# Patient Record
Sex: Female | Born: 1980 | Race: Black or African American | Hispanic: No | Marital: Single | Smoking: Never smoker
Health system: Southern US, Community
[De-identification: ages and names within clinical notes are randomized; demographics above are authoritative.]

## PROBLEM LIST (undated history)

## (undated) DIAGNOSIS — I619 Nontraumatic intracerebral hemorrhage, unspecified: Secondary | ICD-10-CM

## (undated) DIAGNOSIS — H9193 Unspecified hearing loss, bilateral: Secondary | ICD-10-CM

## (undated) DIAGNOSIS — H547 Unspecified visual loss: Secondary | ICD-10-CM

## (undated) HISTORY — PX: SCAR REVISION: SHX5285

## (undated) HISTORY — PX: PATENT DUCTUS ARTERIOUS REPAIR: SHX269

## (undated) HISTORY — DX: Unspecified hearing loss, bilateral: H91.93

## (undated) HISTORY — DX: Unspecified visual loss: H54.7

## (undated) HISTORY — PX: WISDOM TOOTH EXTRACTION: SHX21

---
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
## 2018-02-27 ENCOUNTER — Ambulatory Visit (INDEPENDENT_AMBULATORY_CARE_PROVIDER_SITE_OTHER): Payer: BLUE CROSS/BLUE SHIELD | Admitting: Obstetrics and Gynecology

## 2018-02-27 ENCOUNTER — Encounter: Payer: Self-pay | Admitting: Obstetrics and Gynecology

## 2018-02-27 ENCOUNTER — Ambulatory Visit: Payer: Self-pay | Admitting: Obstetrics and Gynecology

## 2018-02-27 ENCOUNTER — Other Ambulatory Visit (HOSPITAL_COMMUNITY)
Admission: RE | Admit: 2018-02-27 | Discharge: 2018-02-27 | Disposition: A | Discharge status: Home / Self Care | Payer: BLUE CROSS/BLUE SHIELD | Source: Ambulatory Visit | Attending: Obstetrics and Gynecology | Admitting: Obstetrics and Gynecology

## 2018-02-27 VITALS — BP 107/76 | HR 109 | Ht 64.0 in | Wt 131.5 lb

## 2018-02-27 DIAGNOSIS — Z01419 Encounter for gynecological examination (general) (routine) without abnormal findings: Secondary | ICD-10-CM | POA: Insufficient documentation

## 2018-02-27 DIAGNOSIS — Z1151 Encounter for screening for human papillomavirus (HPV): Secondary | ICD-10-CM | POA: Insufficient documentation

## 2018-02-27 NOTE — Progress Notes (Signed)
GYNECOLOGY ANNUAL PREVENTATIVE CARE ENCOUNTER NOTE  Subjective:   Janet Dennis is a 37 y.o. G0 female here for a annual gynecologic exam. Current complaints: none. Has monthly periods, lasting 3-4 days, light.  Denies abnormal vaginal bleeding, discharge, pelvic pain, or other gynecologic concerns.   Has never been sexually active, declines STI testing.    Gynecologic History Patient's last menstrual period was 02/21/2018. Contraception: abstinence Last Pap: unable to be done secondary to patient discomfort (2016) Last mammogram: n/a  Obstetric History OB History  Gravida Para Term Preterm AB Living  0 0 0 0 0 0  SAB TAB Ectopic Multiple Live Births  0 0 0 0 0    Past Medical History:  Diagnosis Date  . Bilateral hearing loss   . Blind [id number]   Past Surgical History:  Procedure Laterality Date  . WISDOM TOOTH EXTRACTION     No current outpatient medications on file prior to visit.   No current facility-administered medications on file prior to visit.    Not on File  Social History   Socioeconomic History  . Marital status: Single    Spouse name: Not on file  . Number of children: Not on file  . Years of education: Not on file  . Highest education level: Not on file  Occupational History  . Not on file  Social Needs  . Financial resource strain: Not on file  . Food insecurity:    Worry: Not on file    Inability: Not on file  . Transportation needs:    Medical: Not on file    Non-medical: Not on file  Tobacco Use  . Smoking status: Never Smoker  . Smokeless tobacco: Never Used  Substance and Sexual Activity  . Alcohol use: Yes    Comment: occ  . Drug use: Never  . Sexual activity: Never    Birth control/protection: None  Lifestyle  . Physical activity:    Days per week: Not on file    Minutes per session: Not on file  . Stress: Not on file  Relationships  . Social connections:    Talks on phone: Not on file    Gets together: Not on  file    Attends religious service: Not on file    Active member of club or organization: Not on file    Attends meetings of clubs or organizations: Not on file    Relationship status: Not on file  . Intimate partner violence:    Fear of current or ex partner: Not on file    Emotionally abused: Not on file    Physically abused: Not on file    Forced sexual activity: Not on file  Other Topics Concern  . Not on file  Social History Narrative  . Not on file   History reviewed. No pertinent family history.  Diet: "pretty okay" Exercise: goes with mom to walk  The following portions of the patient's history were reviewed and updated as appropriate: allergies, current medications, past family history, past medical history, past social history, past surgical history and problem list.  Review of Systems Pertinent items are noted in HPI.   Objective:  BP 107/76   Pulse (!) 109   Ht 5\' 4"  (1.626 m)   Wt 131 lb 8 oz (59.6 kg)   LMP 02/21/2018   BMI 22.57 kg/m  CONSTITUTIONAL: Well-developed, well-nourished female in no acute distress.  HENT:  Normocephalic, atraumatic, External right and left ear normal. Oropharynx is clear  and moist EYES: gray scarring over both cornea, patient is blind  NECK: Normal range of motion, supple, no masses.  Normal thyroid.  SKIN: Skin is warm and dry. No rash noted. Not diaphoretic. No erythema. No pallor. NEUROLOGIC: Alert and oriented to person, place, and time. Normal reflexes, muscle tone coordination. Hard of hearing with hearing aid PSYCHIATRIC: Normal mood and affect. Normal behavior. Normal judgment and thought content. CARDIOVASCULAR: Normal heart rate noted, regular rhythm RESPIRATORY: Clear to auscultation bilaterally. Effort and breath sounds normal, no problems with respiration noted. BREASTS: Symmetric in size. No masses, skin changes, nipple drainage, or lymphadenopathy. ABDOMEN: Soft, normal bowel sounds, no distention noted.  No  tenderness, rebound or guarding.  PELVIC: Normal appearing external genitalia; virginal introitus, no hymen present.  Some white vaginal dsicharge noted. Adolescent speculum used with great discomfort to patient, cervix high and not well visualized, blind pap of posterior fornix/cervix done.  Normal uterine size, no other palpable masses, no uterine or adnexal tenderness. MUSCULOSKELETAL: Normal range of motion. No tenderness.  No cyanosis, clubbing, or edema. 2+ distal pulses.   Assessment and Plan:   1. Well woman exam Difficult exam due to patient discomfort (not sexually active with very small introitus, cervix not visualized) but otherwise benign exam - Cytology - PAP  Will follow up results of pap smear and manage accordingly. Encouraged improvement in diet and exercise.    Routine preventative health maintenance measures emphasized. Please refer to After Visit Summary for other counseling recommendations.    Baldemar Lenis, M.D. [facility]

## 2018-02-27 NOTE — Progress Notes (Signed)
Pt presents as New pt. Pt has not other concerns. Just needs pap smear. Pt moved to area in 2017. Pap has not been done since moved to this area.

## 2018-03-01 LAB — CYTOLOGY - PAP
Adequacy: ABSENT
Diagnosis: NEGATIVE
HPV: NOT DETECTED

## 2020-08-03 DIAGNOSIS — R569 Unspecified convulsions: Secondary | ICD-10-CM

## 2020-08-03 HISTORY — DX: Unspecified convulsions: R56.9

## 2020-08-17 ENCOUNTER — Emergency Department (HOSPITAL_COMMUNITY): Payer: BC Managed Care – PPO

## 2020-08-17 ENCOUNTER — Encounter (HOSPITAL_COMMUNITY): Payer: Self-pay

## 2020-08-17 ENCOUNTER — Observation Stay (HOSPITAL_COMMUNITY)
Admission: EM | Admit: 2020-08-17 | Discharge: 2020-08-19 | Disposition: A | Discharge status: Home / Self Care | Payer: BC Managed Care – PPO | Attending: Internal Medicine | Admitting: Internal Medicine

## 2020-08-17 ENCOUNTER — Other Ambulatory Visit: Payer: Self-pay

## 2020-08-17 DIAGNOSIS — R000 Tachycardia, unspecified: Secondary | ICD-10-CM | POA: Diagnosis not present

## 2020-08-17 DIAGNOSIS — E0590 Thyrotoxicosis, unspecified without thyrotoxic crisis or storm: Secondary | ICD-10-CM | POA: Diagnosis present

## 2020-08-17 DIAGNOSIS — D649 Anemia, unspecified: Secondary | ICD-10-CM | POA: Insufficient documentation

## 2020-08-17 DIAGNOSIS — E871 Hypo-osmolality and hyponatremia: Secondary | ICD-10-CM | POA: Diagnosis present

## 2020-08-17 DIAGNOSIS — R569 Unspecified convulsions: Principal | ICD-10-CM

## 2020-08-17 DIAGNOSIS — D509 Iron deficiency anemia, unspecified: Secondary | ICD-10-CM | POA: Diagnosis not present

## 2020-08-17 DIAGNOSIS — Z20822 Contact with and (suspected) exposure to covid-19: Secondary | ICD-10-CM | POA: Insufficient documentation

## 2020-08-17 HISTORY — DX: Nontraumatic intracerebral hemorrhage, unspecified: I61.9

## 2020-08-17 LAB — MAGNESIUM: Magnesium: 1.8 mg/dL (ref 1.7–2.4)

## 2020-08-17 LAB — CBC WITH DIFFERENTIAL/PLATELET
Abs Immature Granulocytes: 0.02 10*3/uL (ref 0.00–0.07)
Basophils Absolute: 0 10*3/uL (ref 0.0–0.1)
Basophils Relative: 0 %
Eosinophils Absolute: 0 10*3/uL (ref 0.0–0.5)
Eosinophils Relative: 1 %
HCT: 36 % (ref 36.0–46.0)
Hemoglobin: 11.3 g/dL — ABNORMAL LOW (ref 12.0–15.0)
Immature Granulocytes: 0 %
Lymphocytes Relative: 43 %
Lymphs Abs: 2.2 10*3/uL (ref 0.7–4.0)
MCH: 25 pg — ABNORMAL LOW (ref 26.0–34.0)
MCHC: 31.4 g/dL (ref 30.0–36.0)
MCV: 79.6 fL — ABNORMAL LOW (ref 80.0–100.0)
Monocytes Absolute: 0.6 10*3/uL (ref 0.1–1.0)
Monocytes Relative: 11 %
Neutro Abs: 2.2 10*3/uL (ref 1.7–7.7)
Neutrophils Relative %: 45 %
Platelets: 173 10*3/uL (ref 150–400)
RBC: 4.52 MIL/uL (ref 3.87–5.11)
RDW: 14.9 % (ref 11.5–15.5)
WBC: 5 10*3/uL (ref 4.0–10.5)
nRBC: 0 % (ref 0.0–0.2)

## 2020-08-17 LAB — BASIC METABOLIC PANEL
Anion gap: 10 (ref 5–15)
BUN: 15 mg/dL (ref 6–20)
CO2: 17 mmol/L — ABNORMAL LOW (ref 22–32)
Calcium: 9 mg/dL (ref 8.9–10.3)
Chloride: 102 mmol/L (ref 98–111)
Creatinine, Ser: 0.65 mg/dL (ref 0.44–1.00)
GFR, Estimated: >60 mL/min (ref >60)
Glucose, Bld: 72 mg/dL (ref 70–99)
Potassium: 5.2 mmol/L — ABNORMAL HIGH (ref 3.5–5.1)
Sodium: 129 mmol/L — ABNORMAL LOW (ref 135–145)

## 2020-08-17 LAB — I-STAT BETA HCG BLOOD, ED (MC, WL, AP ONLY): I-stat hCG, quantitative: <5 m[IU]/mL (ref <5)

## 2020-08-17 LAB — TSH: TSH: <0.01 u[IU]/mL — ABNORMAL LOW (ref 0.350–4.500)

## 2020-08-17 LAB — RESP PANEL BY RT-PCR (FLU A&B, COVID) ARPGX2
Influenza A by PCR: NEGATIVE
Influenza B by PCR: NEGATIVE
SARS Coronavirus 2 by RT PCR: NEGATIVE

## 2020-08-17 LAB — CBG MONITORING, ED: Glucose-Capillary: 69 mg/dL — ABNORMAL LOW (ref 70–99)

## 2020-08-17 LAB — D-DIMER, QUANTITATIVE: D-Dimer, Quant: 0.62 ug/mL-FEU — ABNORMAL HIGH (ref 0.00–0.50)

## 2020-08-17 MED ORDER — SODIUM CHLORIDE 0.9 % IV BOLUS
1000.0000 mL | Freq: Once | INTRAVENOUS | Status: AC
  Administered 2020-08-17: 1000 mL via INTRAVENOUS

## 2020-08-17 MED ORDER — IOHEXOL 350 MG/ML SOLN
50.0000 mL | Freq: Once | INTRAVENOUS | Status: AC | PRN
  Administered 2020-08-17: 50 mL via INTRAVENOUS

## 2020-08-17 MED ORDER — METOPROLOL TARTRATE 25 MG PO TABS
25.0000 mg | ORAL_TABLET | Freq: Once | ORAL | Status: AC
  Administered 2020-08-17: 25 mg via ORAL
  Filled 2020-08-17: qty 1

## 2020-08-17 NOTE — ED Triage Notes (Signed)
EMS reports pt is from home. Pt is blind and hearing impaired. Family is on the way. Family checked on her and was in bed and then moved to floor, found  Having convulsions - No history of seizures. Pt has been complaining of hot flashes recently. Had tylenol x 2 today.

## 2020-08-17 NOTE — ED Notes (Signed)
Dr. Renaye Rakers is at bedside at this time.

## 2020-08-17 NOTE — ED Provider Notes (Signed)
[facility] EMERGENCY DEPARTMENT Provider Note   CSN: [id number] Arrival date & time: 08/17/20  1944     History CC:  Shaking episode, tachycardia  Janet Dennis is a 40 y.o. female w/ hx of prematurity, blindness, presenting to ED with tachycardia and concern for possible seizure.  EMS reports family saw patient having generalized convulsions in bedroom today.  No prior hx of seizures.  Pt reporting "hot flashes" for several days, but otherwise feeling normal today.  She has no acute complaints, denies palpitations, HA, chest pain or pressure.    Father had graves disease or hyperthyroidism No family hx of seizures  Update  - mother reports she walked into bedroom this afternoon to find pt sitting on bed, unresponsive, clenched up, made a garbling noise.  She was confused until arriving in the Ed.  Patient does not recall these events at all.  At baseline pt awake and communicative.  No hx of sig developmental delay.  HPI     Past Medical History:  Diagnosis Date  . Bilateral hearing loss   . Blind 1982    There are no problems to display for this patient.   Past Surgical History:  Procedure Laterality Date  . WISDOM TOOTH EXTRACTION       OB History    Gravida  0   Para  0   Term  0   Preterm  0   AB  0   Living  0     SAB  0   IAB  0   Ectopic  0   Multiple  0   Live Births  0           History reviewed. No pertinent family history.  Social History   Tobacco Use  . Smoking status: Never Smoker  . Smokeless tobacco: Never Used  Vaping Use  . Vaping Use: Never used  Substance Use Topics  . Alcohol use: Yes    Comment: occ  . Drug use: Never    Home Medications Prior to Admission medications   Not on File    Allergies    Patient has no known allergies.  Review of Systems   Review of Systems  Constitutional: Negative for chills and fever.  HENT: Negative for ear pain and sore throat.   Eyes:  Negative for pain and visual disturbance.  Respiratory: Negative for cough and shortness of breath.   Cardiovascular: Negative for chest pain and palpitations.  Gastrointestinal: Negative for abdominal pain and vomiting.  Musculoskeletal: Negative for arthralgias and back pain.  Skin: Negative for color change and rash.  Neurological: Positive for syncope. Negative for weakness.  All other systems reviewed and are negative.   Physical Exam Updated Vital Signs Pulse (!) 154   Temp 98 F (36.7 C) (Oral)   Resp (!) 21   Ht 5\' 4"  (1.626 m)   Wt 59.6 kg   SpO2 100%   BMI 22.55 kg/m   Physical Exam Constitutional:      General: She is not in acute distress.    Comments: Slow speech (baseline per parents)  HENT:     Head: Normocephalic and atraumatic.  Eyes:     Comments: Blind  Cardiovascular:     Rate and Rhythm: Regular rhythm. Tachycardia present.  Pulmonary:     Effort: Pulmonary effort is normal. No respiratory distress.  Abdominal:     General: There is no distension.     Tenderness: There is no abdominal  tenderness.  Skin:    General: Skin is warm and dry.  Neurological:     General: No focal deficit present.     Mental Status: She is alert and oriented to person, place, and time. Mental status is at baseline.     Sensory: No sensory deficit.     Motor: No weakness.  Psychiatric:        Mood and Affect: Mood normal.        Behavior: Behavior normal.     ED Results / Procedures / Treatments   Labs (all labs ordered are listed, but only abnormal results are displayed) Labs Reviewed - No data to display  EKG EKG Interpretation  Date/Time:  Tuesday August 17 2020 20:01:12 EDT Ventricular Rate:  153 PR Interval:    QRS Duration: 73 QT Interval:  267 QTC Calculation: 426 R Axis:   82 Text Interpretation: Sinus tachycardia Nonspecific T abnormalities, lateral leads No STEMI Confirmed by Alvester Chou [phone]) on 08/17/2020 8:07:39 PM   Radiology No  results found.  Procedures Procedures   Medications Ordered in ED Medications - No data to display  ED Course  I have reviewed the triage vital signs and the nursing notes.  Pertinent labs & imaging results that were available during my care of the patient were reviewed by me and considered in my medical decision making (see chart for details).  40 yo here with possible new onset seizures today, per mother's description, and tachycardia on arrival  DDx includes metabolic derangement vs infection vs thyroid dysfunction vs arrhythmia vs other  ECG on arrival shows sinus tachycardia, telemetry likewise with HR 150-160 bpm.  BP stable.  Pt asymptomatic with this.   Labs reviewed - TSH very low.  Will need T4, T3 levels - likely this is clinical hyperthyroidism.  Doubt thyroid storm at this time.  Metoprolol PO was given for HR control.  She feels well otherwise.  DDimer was elevated.  Patient pending CT PE study.  Parents at bedside provided additional history  Neurology consulted by phone - recommending MRI brain given hx of childhood prematurity.  See ED course below.   Clinical Course as of 08/18/20 0118  Tue Aug 17, 2020  2223 TSH(!): <0.010 [MT]  2338 Dr Marin Roberts from neurology recommending f/u MRI brain.  If unremarkable she could f/u with neurology as outpatient, does NOT need AED initiation at this time.  If patient is being admitted for other reasons, she could have a routine EEG performed in the hospital.  Neurology will be available for further consultation if there are repeat seizures or brain abnormalities found. [MT]  Wed Aug 18, 2020  0020 Signed out to Dr Bebe Shaggy EDP pending MRI (pt transporting to MRI now), reassessment of HR and thyroid levels upon return.   [MT]    Clinical Course User Index [MT] Jannel Lynne, Kermit Balo, MD    Final Clinical Impression(s) / ED Diagnoses Final diagnoses:  None    Rx / DC Orders ED Discharge Orders    None       Terald Sleeper, MD 08/18/20 [phone]

## 2020-08-18 ENCOUNTER — Emergency Department (HOSPITAL_COMMUNITY): Payer: BC Managed Care – PPO

## 2020-08-18 ENCOUNTER — Encounter (HOSPITAL_COMMUNITY): Payer: Self-pay | Admitting: Internal Medicine

## 2020-08-18 ENCOUNTER — Observation Stay (HOSPITAL_COMMUNITY): Payer: BC Managed Care – PPO

## 2020-08-18 DIAGNOSIS — G40109 Localization-related (focal) (partial) symptomatic epilepsy and epileptic syndromes with simple partial seizures, not intractable, without status epilepticus: Secondary | ICD-10-CM

## 2020-08-18 DIAGNOSIS — E0590 Thyrotoxicosis, unspecified without thyrotoxic crisis or storm: Secondary | ICD-10-CM | POA: Diagnosis not present

## 2020-08-18 DIAGNOSIS — G9381 Temporal sclerosis: Secondary | ICD-10-CM | POA: Diagnosis not present

## 2020-08-18 DIAGNOSIS — G40A09 Absence epileptic syndrome, not intractable, without status epilepticus: Secondary | ICD-10-CM | POA: Diagnosis not present

## 2020-08-18 DIAGNOSIS — E871 Hypo-osmolality and hyponatremia: Secondary | ICD-10-CM

## 2020-08-18 DIAGNOSIS — R000 Tachycardia, unspecified: Secondary | ICD-10-CM

## 2020-08-18 DIAGNOSIS — R569 Unspecified convulsions: Secondary | ICD-10-CM | POA: Diagnosis not present

## 2020-08-18 LAB — URINALYSIS, ROUTINE W REFLEX MICROSCOPIC
Bilirubin Urine: NEGATIVE
Glucose, UA: NEGATIVE mg/dL
Ketones, ur: 20 mg/dL — AB
Leukocytes,Ua: NEGATIVE
Nitrite: NEGATIVE
Protein, ur: NEGATIVE mg/dL
Specific Gravity, Urine: 1.016 (ref 1.005–1.030)
pH: 5 (ref 5.0–8.0)

## 2020-08-18 LAB — CBC WITH DIFFERENTIAL/PLATELET
Abs Immature Granulocytes: 0.02 10*3/uL (ref 0.00–0.07)
Basophils Absolute: 0 10*3/uL (ref 0.0–0.1)
Basophils Relative: 0 %
Eosinophils Absolute: 0 10*3/uL (ref 0.0–0.5)
Eosinophils Relative: 0 %
HCT: 32.1 % — ABNORMAL LOW (ref 36.0–46.0)
Hemoglobin: 10.5 g/dL — ABNORMAL LOW (ref 12.0–15.0)
Immature Granulocytes: 0 %
Lymphocytes Relative: 23 %
Lymphs Abs: 1.5 10*3/uL (ref 0.7–4.0)
MCH: 25.3 pg — ABNORMAL LOW (ref 26.0–34.0)
MCHC: 32.7 g/dL (ref 30.0–36.0)
MCV: 77.3 fL — ABNORMAL LOW (ref 80.0–100.0)
Monocytes Absolute: 0.6 10*3/uL (ref 0.1–1.0)
Monocytes Relative: 10 %
Neutro Abs: 4.3 10*3/uL (ref 1.7–7.7)
Neutrophils Relative %: 67 %
Platelets: 286 10*3/uL (ref 150–400)
RBC: 4.15 MIL/uL (ref 3.87–5.11)
RDW: 14.8 % (ref 11.5–15.5)
WBC: 6.4 10*3/uL (ref 4.0–10.5)
nRBC: 0 % (ref 0.0–0.2)

## 2020-08-18 LAB — BASIC METABOLIC PANEL
Anion gap: 9 (ref 5–15)
Anion gap: 6 (ref 5–15)
BUN: 9 mg/dL (ref 6–20)
BUN: 7 mg/dL (ref 6–20)
CO2: 21 mmol/L — ABNORMAL LOW (ref 22–32)
CO2: 23 mmol/L (ref 22–32)
Calcium: 8.9 mg/dL (ref 8.9–10.3)
Calcium: 9.1 mg/dL (ref 8.9–10.3)
Chloride: 110 mmol/L (ref 98–111)
Chloride: 113 mmol/L — ABNORMAL HIGH (ref 98–111)
Creatinine, Ser: 0.61 mg/dL (ref 0.44–1.00)
Creatinine, Ser: 0.53 mg/dL (ref 0.44–1.00)
GFR, Estimated: >60 mL/min (ref >60)
GFR, Estimated: >60 mL/min (ref >60)
Glucose, Bld: 116 mg/dL — ABNORMAL HIGH (ref 70–99)
Glucose, Bld: 139 mg/dL — ABNORMAL HIGH (ref 70–99)
Potassium: 3.2 mmol/L — ABNORMAL LOW (ref 3.5–5.1)
Potassium: 3.8 mmol/L (ref 3.5–5.1)
Sodium: 140 mmol/L (ref 135–145)
Sodium: 142 mmol/L (ref 135–145)

## 2020-08-18 LAB — HEPATIC FUNCTION PANEL
ALT: 29 U/L (ref 0–44)
AST: 26 U/L (ref 15–41)
Albumin: 2.8 g/dL — ABNORMAL LOW (ref 3.5–5.0)
Alkaline Phosphatase: 76 U/L (ref 38–126)
Bilirubin, Direct: 0.2 mg/dL (ref 0.0–0.2)
Indirect Bilirubin: 1.3 mg/dL — ABNORMAL HIGH (ref 0.3–0.9)
Total Bilirubin: 1.5 mg/dL — ABNORMAL HIGH (ref 0.3–1.2)
Total Protein: 5.9 g/dL — ABNORMAL LOW (ref 6.5–8.1)

## 2020-08-18 LAB — HIV ANTIBODY (ROUTINE TESTING W REFLEX): HIV Screen 4th Generation wRfx: NONREACTIVE

## 2020-08-18 LAB — MAGNESIUM: Magnesium: 1.6 mg/dL — ABNORMAL LOW (ref 1.7–2.4)

## 2020-08-18 LAB — T4, FREE: Free T4: 5.47 ng/dL — ABNORMAL HIGH (ref 0.61–1.12)

## 2020-08-18 MED ORDER — LEVETIRACETAM IN NACL 500 MG/100ML IV SOLN
500.0000 mg | Freq: Once | INTRAVENOUS | Status: DC
  Administered 2020-08-18: 500 mg via INTRAVENOUS
  Filled 2020-08-18: qty 100

## 2020-08-18 MED ORDER — SODIUM CHLORIDE 0.9 % IV BOLUS
1000.0000 mL | Freq: Once | INTRAVENOUS | Status: AC
  Administered 2020-08-18: 1000 mL via INTRAVENOUS

## 2020-08-18 MED ORDER — POTASSIUM CHLORIDE CRYS ER 20 MEQ PO TBCR
40.0000 meq | EXTENDED_RELEASE_TABLET | Freq: Once | ORAL | Status: AC
  Administered 2020-08-18: 40 meq via ORAL
  Filled 2020-08-18: qty 2

## 2020-08-18 MED ORDER — LEVETIRACETAM 500 MG PO TABS
500.0000 mg | ORAL_TABLET | Freq: Two times a day (BID) | ORAL | Status: DC
  Administered 2020-08-18 – 2020-08-19 (×3): 500 mg via ORAL
  Filled 2020-08-18 (×3): qty 1

## 2020-08-18 MED ORDER — ENOXAPARIN SODIUM 40 MG/0.4ML ~~LOC~~ SOLN
40.0000 mg | Freq: Every day | SUBCUTANEOUS | Status: DC
  Administered 2020-08-18 – 2020-08-19 (×2): 40 mg via SUBCUTANEOUS
  Filled 2020-08-18 (×2): qty 0.4

## 2020-08-18 MED ORDER — PROPRANOLOL HCL 20 MG PO TABS
20.0000 mg | ORAL_TABLET | Freq: Two times a day (BID) | ORAL | Status: DC
  Administered 2020-08-18 – 2020-08-19 (×3): 20 mg via ORAL
  Filled 2020-08-18 (×4): qty 1

## 2020-08-18 MED ORDER — LEVETIRACETAM IN NACL 500 MG/100ML IV SOLN
500.0000 mg | Freq: Once | INTRAVENOUS | Status: AC
  Administered 2020-08-18: 500 mg via INTRAVENOUS
  Filled 2020-08-18: qty 100

## 2020-08-18 MED ORDER — LACTATED RINGERS IV BOLUS
1000.0000 mL | Freq: Once | INTRAVENOUS | Status: AC
  Administered 2020-08-18: 1000 mL via INTRAVENOUS

## 2020-08-18 MED ORDER — ACETAMINOPHEN 650 MG RE SUPP: 650.0000 mg | Freq: Four times a day (QID) | RECTAL | Status: DC | PRN

## 2020-08-18 MED ORDER — LEVETIRACETAM IN NACL 1000 MG/100ML IV SOLN: 1000.0000 mg | Freq: Once | INTRAVENOUS | Status: DC

## 2020-08-18 MED ORDER — METHIMAZOLE 5 MG PO TABS
10.0000 mg | ORAL_TABLET | Freq: Two times a day (BID) | ORAL | Status: DC
  Administered 2020-08-18 – 2020-08-19 (×3): 10 mg via ORAL
  Filled 2020-08-18: qty 1
  Filled 2020-08-18 (×2): qty 2
  Filled 2020-08-18: qty 1

## 2020-08-18 MED ORDER — ACETAMINOPHEN 325 MG PO TABS: 650.0000 mg | ORAL_TABLET | Freq: Four times a day (QID) | ORAL | Status: DC | PRN

## 2020-08-18 NOTE — Progress Notes (Signed)
Neurology Brief F/u Note  Please see full neurology consult note with recommendations from Dr. London Sheer same day. Routine EEG normal. Referral placed for outpatient neurology f/u after discharge.  Neurology to sign off, but please re-engage if new neurologic concerns arise.  Bing Neighbors, MD [facility] [phone]  If 7pm- 7am, please page neurology on call as listed in [facility].

## 2020-08-18 NOTE — ED Notes (Signed)
Pts gown and bedding changed.

## 2020-08-18 NOTE — Consult Note (Addendum)
NEUROLOGY CONSULTATION NOTE   Date of service: August 18, 2020 Patient Name: Janet Dennis MRN:  [id number] DOB:  01/03/1981 Reason for consult: "Seizure" _ _ _   _ __   _ __ _ _  __ __   _ __   __ _  History of Present Illness  Janet Dennis is a 40 y.o. 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 Patient's mother witnessed much of the episode.  Patient reports that she just got out of the shower (was sitting in the bed that the last thing that she remembers.  Patient wants report that she heard the patient get on the phone and noted that she was sitting at the edge of the bed with her head down.  She called out her name with no response, asked if she was doing okay with no response.  She noted that patient's hands rose up and was very stiff and she made a weird noise with some foaming at her mouth.  She immediately got the patient and helped her down to the ground and lay her on her side and by that time the event was over and patient appeared to be in deep sleep.  She immediately called EMS who arrived with a couple minutes and noted the patient was somnolent and not following any commands.  She was brought into the ED and was waking up more in the ED.  The entirety of the event lasted about somewhere between 1 to 2 minutes.  Patient has no recollection of the event.  Patient denies any recent signs or symptoms of an upper respiratory infection or UTI or gastroenteritis.  Endorses unintentional weight loss with hot flashes over the last several weeks along with sweating.  Denies any prior history of CNS infection, no history of strokes, no significant head injury with loss of consciousness, denies any recent sleep deprivation, no significant alcohol intake, does not use any recreational drugs.  She had work-up with MRI brain without contrast which demonstrated question asymmetric atrophy of the right  hippocampal formation with subtle loss of the normal internal hippocampal structures increased flair signal abnormality.  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   ROS   Constitutional Denies weight loss, fever and chills.  HEENT Blind at baseline, no hearing loss.  Respiratory Denies SOB and cough.  CV Denies palpitations and CP  GI Denies abdominal pain, nausea, vomiting and diarrhea.  GU Denies dysuria and urinary frequency.  MSK Denies myalgia and joint pain.  Skin Denies rash and pruritus.  Neurological Denies headache and syncope.  Psychiatric Denies recent changes in mood. Denies anxiety and depression.   Past History   Past Medical History:  Diagnosis Date  . Bilateral hearing loss   . Blind 1982  . 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 Premature baby    Past Surgical History:  Procedure Laterality Date  . WISDOM TOOTH EXTRACTION     History reviewed. No pertinent family history. Social History   Socioeconomic History  . Marital status: Single    Spouse name: Not on file  . Number of children: Not on file  . Years of education: Not on file  . Highest education level: Not on file  Occupational History  . Not on file  Tobacco Use  . Smoking status: Never Smoker  . Smokeless tobacco: Never Used  Vaping Use  . Vaping Use:  Never used  Substance and Sexual Activity  . Alcohol use: Yes    Comment: occ  . Drug use: Never  . Sexual activity: Never    Birth control/protection: None  Other Topics Concern  . Not on file  Social History Narrative  . Not on file   Social Determinants of Health   Financial Resource Strain: Not on file  Food Insecurity: Not on file  Transportation Needs: Not on file  Physical Activity: Not on file  Stress: Not on file  Social Connections: Not on file   No Known Allergies  Medications  (Not in a hospital admission)    Vitals   Vitals:   08/17/20 2230 08/17/20 2245 08/17/20 2300 08/18/20  0018  BP: (!) 138/98 139/86 112/85   Pulse: (!) 123 (!) 120 (!) 111   Resp: (!) 23 (!) 24 (!) 30   Temp:    98.4 F (36.9 C)  TempSrc:    Oral  SpO2: 98% 98% 98%   Weight:      Height:         Body mass index is 22.31 kg/m.  Physical Exam   General: Laying comfortably in bed; in no acute distress. HENT: Normal oropharynx and mucosa. Normal external appearance of ears and nose. Neck: Supple, no pain or tenderness CV: No JVD. No peripheral edema. Pulmonary: Symmetric Chest rise. Normal respiratory effort.  Abdomen: Soft to touch, non-tender.  Ext: No cyanosis, edema, or deformity  Skin: No rash. Normal palpation of skin.   Musculoskeletal: Normal digits and nails by inspection. No clubbing.   Neurologic Examination  Mental status/Cognition: Alert, oriented to self, place, month and year, good attention.  Speech/language: Fluent, comprehension intact, object naming intact, repetition intact.  Cranial nerves:   CN II Has a little light perception only. Unable to get her to open her eyes to assess pupil size or shape or reaction.   CN III,IV,VI Attempts EOM and they are intact, no gaze preference or deviation, direction changing nystagmus in primary gaze and gaze evoked in all directions.   CN V normal sensation in V1, V2, and V3 segments bilaterally    CN VII no asymmetry, no nasolabial fold flattening    CN VIII normal hearing to speech    CN IX & X normal palatal elevation, no uvular deviation    CN XI 5/5 head turn and 5/5 shoulder shrug bilaterally    CN XII midline tongue protrusion    Motor:  Muscle bulk: poor, tone hypotonia, poorly coordinated movement of BL upper and lower extremities. Mvmt Root Nerve  Muscle Right Left Comments  SA C5/6 Ax Deltoid     EF C5/6 Mc Biceps 4 4   EE C6/7/8 Rad Triceps 4 4   WF C6/7 Med FCR     WE C7/8 PIN ECU     F Ab C8/T1 U ADM/FDI 4 4   HF L1/2/3 Fem Illopsoas 3 3   KE L2/3/4 Fem Quad     DF L4/5 D Peron Tib Ant 4 4   PF S1/2  Tibial Grc/Sol      Reflexes:  Right Left Comments  Pectoralis      Biceps (C5/6) 1 1   Brachioradialis (C5/6) 2 2    Triceps (C6/7) 1 1    Patellar (L3/4) 2 2    Achilles (S1) 2 2    Hoffman      Plantar withdraws withdraws   Jaw jerk    Sensation:  Light touch intact  Pin prick    Temperature    Vibration   Proprioception    Coordination/Complex Motor:  - Finger to Nose with ataxia and poor coordination. - Heel to shin unable to do. - Gait: Deferred.  Labs   CBC:  Recent Labs  Lab 08/17/20 2025  WBC 5.0  NEUTROABS 2.2  HGB 11.3*  HCT 36.0  MCV 79.6*  PLT 173    Basic Metabolic Panel:  Lab Results  Component Value Date   NA 129 (L) 08/17/2020   K 5.2 (H) 08/17/2020   CO2 17 (L) 08/17/2020   GLUCOSE 72 08/17/2020   BUN 15 08/17/2020   CREATININE 0.65 08/17/2020   CALCIUM 9.0 08/17/2020   GFRNONAA >60 08/17/2020   Lipid Panel: No results found for: LDLCALC HgbA1c: No results found for: HGBA1C Urine Drug Screen: No results found for: LABOPIA, COCAINSCRNUR, LABBENZ, AMPHETMU, THCU, LABBARB  Alcohol Level No results found for: [facility]  MRI Brain  IMPRESSION: 1. 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 A focal cortical dysplasia would be the primary differential consideration. Correlation with EEG recommended. 2. 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 3. No other acute intracranial abnormality.  rEEG: pending  Impression   Janecia Palau is a 40 y.o. 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 Given findings, will start her on  Keppra 500mg  BID with follow up with neurology outpatient.  Recommendations  - I ordered a routine EEG. - I ordered Keppra 500mg  BID. Discussed behavioral side effects with patient and mom. - Recommend follow up with Neurology outpatient. - Seizure pads. - Discussed seizure precautions. ______________________________________________________________________   Thank you for the opportunity to take part in the care of this patient. If you have any further questions, please contact the neurology consultation attending.  Signed,  [facility] Pager Number _ _ _   _ __   _ __ _ _  __ __   _ __   __ _   Seizure precautions: Per [facility] statutes, patients with seizures are not allowed to drive until they have been seizure-free for six months and cleared by a physician    Use caution when using heavy equipment or power tools. Avoid working on ladders or at heights. Take showers instead of baths. Ensure the water temperature is not too high on the home water heater. Do not go swimming alone. Do not lock yourself in a room alone (i.e. bathroom). When caring for infants or small children, sit down when holding, feeding, or changing them to minimize risk of injury to the child in the event you have a seizure. Maintain good sleep hygiene. Avoid alcohol.    If patient has another seizure, call 911 and bring them back to the ED if: A.  The seizure lasts longer than 5 minutes.      B.  The patient doesn't wake shortly after the seizure or has new problems such as difficulty seeing, speaking or moving following the seizure C.  The patient was injured during the seizure D.  The patient has a temperature over 102 F (39C) E.  The patient vomited during the seizure and now is having trouble  breathing    During the Seizure   - First, ensure adequate ventilation and place patients on the floor on their left side  Loosen clothing around the neck and  ensure the airway is patent. If the patient is clenching the teeth, do not force the mouth open with any object as this can cause severe damage - Remove all items from the surrounding that can be hazardous. The patient may be oblivious to what's happening and may not even know what he or she is doing. If the patient is confused and wandering, either gently guide him/her away and block access to outside areas - Reassure the individual and be comforting - Call 911. In most cases, the seizure ends before EMS arrives. However, there are cases when seizures may last over 3 to 5 minutes. Or the individual may have developed breathing difficulties or severe injuries. If a pregnant patient or a person with diabetes develops a seizure, it is prudent to call an ambulance. - Finally, if the patient does not regain full consciousness, then call EMS. Most patients will remain confused for about 45 to 90 minutes after a seizure, so you must use judgment in calling for help. - Avoid restraints but make sure the patient is in a bed with padded side rails - Place the individual in a lateral position with the neck slightly flexed; this will help the saliva drain from the mouth and prevent the tongue from falling backward - Remove all nearby furniture and other hazards from the area - Provide verbal assurance as the individual is regaining consciousness - Provide the patient with privacy if possible - Call for help and start treatment as ordered by the caregiver    After the Seizure (Postictal Stage)   After a seizure, most patients experience confusion, fatigue, muscle pain and/or a headache. Thus, one should permit the individual to sleep. For the next few days, reassurance is essential. Being calm and helping reorient the person is also of importance.   Most seizures are painless and end spontaneously. Seizures are not harmful to others but can lead to complications such as stress on the lungs, brain and the heart.  Individuals with prior lung problems may develop labored breathing and respiratory distress.

## 2020-08-18 NOTE — Plan of Care (Signed)

## 2020-08-18 NOTE — Progress Notes (Signed)
EEG complete - results pending 

## 2020-08-18 NOTE — Procedures (Signed)
Patient Name: Jaqulyn Chancellor  MRN: [id number]  Epilepsy Attending: Charlsie Quest  Referring Physician/Provider: Dr. Midge Minium Date: 08/18/2020 Duration: 24.06 minutes  Patient history: 40 year old female with first-time seizure-like episode.  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  EEG to evaluate for seizures.  Level of alertness: Awake, asleep  AEDs during EEG study: Keppra  Technical aspects: This EEG study was done with scalp electrodes positioned according to the 10-20 International system of electrode placement. Electrical activity was acquired at a sampling rate of 500Hz  and reviewed with a high frequency filter of 70Hz  and a low frequency filter of 1Hz . EEG data were recorded continuously and digitally stored.   Description: The posterior dominant rhythm consists of 8-9 Hz activity of moderate voltage (25-35 uV) seen predominantly in posterior head regions, symmetric and reactive to eye opening and eye closing. Sleep was characterized by vertex waves, sleep spindles (12 to 14 Hz), maximal frontocentral region. Hyperventilation and photic stimulation were not performed.     IMPRESSION: This study is within normal limits. No seizures or epileptiform discharges were seen throughout the recording.  Joannah Gitlin 

## 2020-08-18 NOTE — H&P (Addendum)
History and Physical    Janet Dennis HQI:[id number] DOB: 05/15/1981 DOA: 08/17/2020  PCP: Verlon AuBoyd, Tammy Lamonica, MD   Patient coming from: Home.  History obtained from patient's father, patient ER physician and neurologist.  Chief Complaint: Seizure.  HPI: Janet Dennis is a 40 y.o. female with history of prematurity with blindness and history of patent ductus arteriosus status post ligation was brought to the ER after patient's mother witnessed a seizure episode at home last evening.  Patient was sitting at the bed just finished talking on the phone and was found to have a staring spell followed by foaming at the mouth and seizure-like episode following which patient's mother helped the patient down onto the floor and called EMS.  EMS on arrival found the patient to be confused.  Patient was brought to the ER.  Initially patient was confused in the ER but later became oriented and at that point patient was stating that she does not recollect the incident.  Has been having some hot flashes and weight loss recently.  ED Course: In the ER patient became more alert awake.  MRI of the brain was showing features concerning for mesial temporal sclerosis.  Neurologist on-call was consulted patient was given Keppra loading dose following which was placed on Keppra 500 p.o. twice daily.  Patient was persistently tachycardic with EKG showing sinus tachycardia with a heart rate in the 130s.  CT angiogram of the chest was negative for pulmonary embolism but did show soft tissue in the anterior mediastinum most consistent with prominent thymus.  Patient's TSH was less than 0.010 and free T4 was 5.47.  Consistent with hypothyroidism.  Covid test is negative.  Patient was initially given metoprolol 25 mg p.o. for the tachycardia.  Review of Systems: As per HPI, rest all negative.   Past Medical History:  Diagnosis Date  . Bilateral hearing loss   . Blind 1982  . 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 Premature baby     Past Surgical History:  Procedure Laterality Date  . WISDOM TOOTH EXTRACTION       reports that she has never smoked. She has never used smokeless tobacco. She reports current alcohol use. She reports that she does not use drugs.  No Known Allergies  History reviewed. No pertinent family history.  Prior to Admission medications   Not on File    Physical Exam: Constitutional: Moderately built and nourished. Vitals:   08/18/20 0018 08/18/20 0244 08/18/20 0300 08/18/20 0415  BP:  111/72 119/81 126/73  Pulse:  (!) 122 (!) 121 (!) 140  Resp:  20 16 (!) 32  Temp: 98.4 F (36.9 C) (!) 97.5 F (36.4 C)    TempSrc: Oral Oral    SpO2:  100% 100% 100%  Weight:      Height:       Eyes: Anicteric no pallor. ENMT: No discharge from the ears eyes nose or mouth. Neck: No muscle.  No neck rigidity.  No obvious mass felt. Respiratory: No rhonchi or crepitations. Cardiovascular: S1-S2 heard. Abdomen: Soft nontender bowel sounds present. Musculoskeletal: No edema. Skin: No rash. Neurologic: Alert awake oriented to time place and person moves all extremities. Psychiatric: Appears normal.   Labs on Admission: I have personally reviewed following labs and imaging studies  CBC: Recent Labs  Lab 08/17/20 2025  WBC 5.0  NEUTROABS 2.2  HGB 11.3*  HCT 36.0  MCV 79.6*  PLT 173   Basic Metabolic Panel:  Recent Labs  Lab 08/17/20 2025  NA 129*  K 5.2*  CL 102  CO2 17*  GLUCOSE 72  BUN 15  CREATININE 0.65  CALCIUM 9.0  MG 1.8   GFR: Estimated Creatinine Clearance: 81.5 mL/min (by C-G formula based on SCr of 0.65 mg/dL). Liver Function Tests: No results for input(s): AST, ALT, ALKPHOS, BILITOT, PROT, ALBUMIN in the last 168 hours. No results for input(s): LIPASE, AMYLASE in the last 168 hours. No results for input(s): AMMONIA in the last 168 hours. Coagulation Profile: No results for input(s): INR, PROTIME in the last 168 hours. Cardiac  Enzymes: No results for input(s): CKTOTAL, CKMB, CKMBINDEX, TROPONINI in the last 168 hours. BNP (last 3 results) No results for input(s): PROBNP in the last 8760 hours. HbA1C: No results for input(s): HGBA1C in the last 72 hours. CBG: Recent Labs  Lab 08/17/20 2009  GLUCAP 69*   Lipid Profile: No results for input(s): CHOL, HDL, LDLCALC, TRIG, CHOLHDL, LDLDIRECT in the last 72 hours. Thyroid Function Tests: Recent Labs    08/17/20 2025  TSH <0.010*  FREET4 5.47*   Anemia Panel: No results for input(s): VITAMINB12, FOLATE, FERRITIN, TIBC, IRON, RETICCTPCT in the last 72 hours. Urine analysis:    Component Value Date/Time   COLORURINE STRAW (A) 08/18/2020 0017   APPEARANCEUR CLEAR 08/18/2020 0017   LABSPEC 1.016 08/18/2020 0017   PHURINE 5.0 08/18/2020 0017   GLUCOSEU NEGATIVE 08/18/2020 0017   HGBUR SMALL (A) 08/18/2020 0017   BILIRUBINUR NEGATIVE 08/18/2020 0017   KETONESUR 20 (A) 08/18/2020 0017   PROTEINUR NEGATIVE 08/18/2020 0017   NITRITE NEGATIVE 08/18/2020 0017   LEUKOCYTESUR NEGATIVE 08/18/2020 0017   Sepsis Labs: @LABRCNTIP (procalcitonin:4,lacticidven:4) ) Recent Results (from the past 240 hour(s))  Resp Panel by RT-PCR (Flu A&B, Covid) Nasopharyngeal Swab     Status: None   Collection Time: 08/17/20  8:25 PM   Specimen: Nasopharyngeal Swab; Nasopharyngeal(NP) swabs in vial transport medium  Result Value Ref Range Status   SARS Coronavirus 2 by RT PCR NEGATIVE NEGATIVE Final    Comment: (NOTE) SARS-CoV-2 target nucleic acids are NOT DETECTED.  The SARS-CoV-2 RNA is generally detectable in upper respiratory specimens during the acute phase of infection. The lowest concentration of SARS-CoV-2 viral copies this assay can detect is 138 copies/mL. A negative result does not preclude SARS-Cov-2 infection and should not be used as the sole basis for treatment or other patient management decisions. A negative result may occur with  improper specimen  collection/handling, submission of specimen other than nasopharyngeal swab, presence of viral mutation(s) within the areas targeted by this assay, and inadequate number of viral copies(<138 copies/mL). A negative result must be combined with clinical observations, patient history, and epidemiological information. The expected result is Negative.  Fact Sheet for Patients:  08/19/20  Fact Sheet for Healthcare Providers:  BloggerCourse.com  This test is no t yet approved or cleared by the [employer] FDA and  has been authorized for detection and/or diagnosis of SARS-CoV-2 by FDA under an Emergency Use Authorization (EUA). This EUA will remain  in effect (meaning this test can be used) for the duration of the COVID-19 declaration under Section 564(b)(1) of the Act, 21 U.S.C.section 360bbb-3(b)(1), unless the authorization is terminated  or revoked sooner.       Influenza A by PCR NEGATIVE NEGATIVE Final   Influenza B by PCR NEGATIVE NEGATIVE Final    Comment: (NOTE) The Xpert Xpress SARS-CoV-2/FLU/RSV plus assay is intended as an aid in the diagnosis of  influenza from Nasopharyngeal swab specimens and should not be used as a sole basis for treatment. Nasal washings and aspirates are unacceptable for Xpert Xpress SARS-CoV-2/FLU/RSV testing.  Fact Sheet for Patients: BloggerCourse.com  Fact Sheet for Healthcare Providers: SeriousBroker.it  This test is not yet approved or cleared by the [place] FDA and has been authorized for detection and/or diagnosis of SARS-CoV-2 by FDA under an Emergency Use Authorization (EUA). This EUA will remain in effect (meaning this test can be used) for the duration of the COVID-19 declaration under Section 564(b)(1) of the Act, 21 U.S.C. section 360bbb-3(b)(1), unless the authorization is terminated or revoked.  Performed at [facility], [address], [place], [place] [address]      Radiological Exams on Admission: CT Angio Chest PE W and/or Wo Contrast  Result Date: 08/18/2020 CLINICAL DATA:  Convulsions EXAM: CT ANGIOGRAPHY CHEST WITH CONTRAST TECHNIQUE: Multidetector CT imaging of the chest was performed using the standard protocol during bolus administration of intravenous contrast. Multiplanar CT image reconstructions and MIPs were obtained to evaluate the vascular anatomy. CONTRAST:  36mL OMNIPAQUE IOHEXOL 350 MG/ML SOLN COMPARISON:  08/17/2020 FINDINGS: Cardiovascular: This is a technically adequate evaluation of the pulmonary vasculature. No filling defects or pulmonary emboli. The heart is unremarkable without pericardial effusion. Normal caliber of the thoracic aorta without aneurysm or dissection. Mediastinum/Nodes: Prominent soft tissue within the anterior mediastinum compatible with thymus. No pathologic adenopathy. Thyroid, trachea, and esophagus are unremarkable. Lungs/Pleura: Scattered areas of scarring are seen at the lung bases and within the right upper lobe. No acute airspace disease, effusion, or pneumothorax. Central airways are patent. Upper Abdomen: No acute abnormality. Musculoskeletal: No acute or destructive bony lesions. Reconstructed images demonstrate no additional findings. Review of the MIP images confirms the above findings. IMPRESSION: 1. No evidence of pulmonary embolus. 2. Soft tissue within the anterior mediastinum, most consistent with prominent thymus. This is abnormal in a patient of this age, and could reflect hyperplasia or thymic rebound. 3. Otherwise no acute intrathoracic process. Electronically Signed   By: Sharlet Salina M.D.   On: 08/18/2020 00:00   MR BRAIN WO CONTRAST  Result Date: 08/18/2020 CLINICAL DATA:  Initial evaluation for acute seizure. History of prematurity, hearing loss, blindness. EXAM: MRI HEAD WITHOUT CONTRAST TECHNIQUE: Multiplanar, multiecho pulse  sequences of the brain and surrounding structures were obtained without intravenous contrast. COMPARISON:  None. FINDINGS: Brain: Cerebral volume within normal limits for age. No abnormal foci of restricted diffusion to suggest acute or subacute ischemia or changes related to acute status. Gray matter differentiation maintained. No encephalomalacia to suggest chronic cortical infarction. No foci of susceptibility artifact to suggest acute or chronic intracranial hemorrhage. No mass lesion, midline shift, or mass effect. No hydrocephalus or extra-axial fluid collection. Pituitary gland and suprasellar region normal. Midline structures intact. Subtly increased FLAIR signal abnormality seen about the right lateral ventricle, nonspecific, but suspected to reflect a degree of chronic PVL related to history of prematurity (series 12, image 20). Thin section imaging through the temporal lobes was performed. There is question of asymmetric atrophy of the right hippocampal formation as compared to the contralateral left (series 11, image 16). Associated asymmetric enlargement of the adjacent right temporal horn. Additionally, the internal architecture of the right hippocampus is subtly not as well defined as compared to the contralateral left hippocampus. Suggestion of subtly increased FLAIR signal abnormality seen at this level as well (series 12, image 11). Findings raise the possibility for changes of mesial temporal  sclerosis. A focal cortical dysplasia would be the primary differential consideration (series 12, image 16). Vascular: Major intracranial vascular flow voids are maintained. Skull and upper cervical spine: Craniocervical junction within normal limits. Bone marrow signal intensity normal. No scalp soft tissue abnormality. Sinuses/Orbits: Chronic changes noted at the globes bilaterally. Globes and orbital soft tissues demonstrate no acute finding. Scattered mucosal thickening noted throughout the visualized  paranasal sinuses. Left maxillary sinus retention cyst partially visualized. Left mastoid air cells are underpneumatized. Right mastoid air cells are largely clear. Other: None. IMPRESSION: 1. 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 A focal cortical dysplasia would be the primary differential consideration. Correlation with EEG recommended. 2. 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 3. No other acute intracranial abnormality. Electronically Signed   By: Rise Mu M.D.   On: 08/18/2020 01:51   DG Chest Portable 1 View  Result Date: 08/17/2020 CLINICAL DATA:  Convulsions. EXAM: PORTABLE CHEST 1 VIEW COMPARISON:  None. FINDINGS: Two small, ill-defined opacities are seen overlying the upper right lung. There is no evidence of a pleural effusion or pneumothorax. The cardiac silhouette is mildly enlarged. A small radiopaque surgical clip is seen overlying the superior mediastinum on the left, just above the level of the hilum. The visualized skeletal structures are unremarkable. IMPRESSION: Small opacities overlying the upper right lung which may represent a small amount of atelectasis versus early infiltrate. Follow-up to resolution is recommended to exclude the presence of an underlying neoplastic process. Electronically Signed   By: Aram Candela M.D.   On: 08/17/2020 20:47    EKG: Independently reviewed.  Sinus tachycardia.  Assessment/Plan Principal Problem:   Seizure (HCC) Active Problems:   Hyponatremia   Hyperthyroidism    1. Seizure -discussed with neurologist.  Appreciate neurology consult and recommendation presently on Keppra 500 p.o. twice daily  after loading dose.  EEG has been ordered and follow-up with neurologist as outpatient.  We will continue to closely monitor and with seizure precaution. 2. Hyperthyroidism -patient labs are consistent with hyperthyroidism.  Discussed with pharmacy will start patient on methimazole 10 mg p.o. twice daily and propranolol 20 mg p.o. twice daily.  Closely monitor.  I also ordered thyrotropin receptor antibodies. 3. Tachycardia likely from thyrotoxicosis.  I think will improve with starting of antithyroid medications and propranolol. 4. Anterior mediastinal soft tissue seen in the CAT scan will need follow-up as outpatient. 5. Hyponatremia follow metabolic panel closely. 6. Anemia follow CBC. 7. History of prematurity with blindness and history of PDA status post ligation.   DVT prophylaxis: Lovenox. Code Status: Full code. Family Communication: Patient's dad at the bedside. Disposition Plan: Home when stable. Consults called: Neurologist. Admission status: Observation.   Eduard Clos MD [facility] Pager [phone].  If 7PM-7AM, please contact night-coverage [email or web address] Password [email or web address]  08/18/2020, 4:26 AM

## 2020-08-18 NOTE — ED Notes (Addendum)
Pt c/o dizziness. MD aware

## 2020-08-18 NOTE — Progress Notes (Signed)
   08/18/20 1225  Assess: MEWS Score  Temp 98.4 F (36.9 C)  BP 119/69  Pulse Rate (!) 116  Resp 16  Level of Consciousness Alert  SpO2 100 %  O2 Device Room Air  Patient Activity (if Appropriate) In bed  Assess: MEWS Score  MEWS Temp 0  MEWS Systolic 0  MEWS Pulse 2  MEWS RR 0  MEWS LOC 0  MEWS Score 2  MEWS Score Color Yellow  Assess: if the MEWS score is Yellow or Red  Were vital signs taken at a resting state? Yes  Focused Assessment No change from prior assessment  Early Detection of Sepsis Score *See Row Information* Low  MEWS guidelines implemented *See Row Information* Yes  Treat  MEWS Interventions Escalated (See documentation below)  Pain Scale 0-10  Pain Score 0  Take Vital Signs  Increase Vital Sign Frequency  Yellow: Q 2hr X 2 then Q 4hr X 2, if remains yellow, continue Q 4hrs  Escalate  MEWS: Escalate Yellow: discuss with charge nurse/RN and consider discussing with provider and RRT  Notify: Charge Nurse/RN  Name of Charge Nurse/RN Notified Elisa,RN  Date Charge Nurse/RN Notified 08/18/20  Time Charge Nurse/RN Notified 1225

## 2020-08-18 NOTE — Progress Notes (Signed)
[facility]                                                                              Patient Demographics  Janet Dennis, is a 40 y.o. female, DOB - 12-06-1980, JME:[id number]  Admit date - 08/17/2020   Admitting Physician Eduard Clos, MD  Outpatient Primary MD for the patient is Verlon Au, MD  Outpatient specialists:   LOS - 0  days   Medical records reviewed and are as summarized below:    No chief complaint on file.      Brief summary   Patient is a 40 year old female with history of prematurity, blindness, PDA status post ligation was brought to ED after patient's mother witnessed a seizure episode at home.  Patient was sitting in the bed just finished talking on the phone, found to have a staring spell followed by foaming at the mouth and seizure-like episode.  On EMS arrival, patient was confused.  In ED, patient became oriented and did not recall the incident.  Patient has been having some hot flashes and weight loss recently.  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  Neurology was consulted, patient was given loading dose of Keppra, followed by Keppra 500 mg twice daily. She was persistently tachycardic.  CTA chest was negative for PE but did show soft tissue in the anterior mediastinum most consistent with prominent thymus. TSH <0.1, free T4 5.47, consistent with hyperthyroidism.  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 twice daily after loading dose -Continue seizure precautions -Showed no seizure or epileptiform discharges  Active Problems: Hyperthyroidism with thyrotoxicosis, tachycardia -CTA chest showed prominent soft tissue within the anterior mediastinum compatible with thymus, no  pathology adenopathy.  Thyroid, trachea and esophagus unremarkable -TSH < 0.01, free T4 5.47, follow thyrotropin receptor antibodies -Placed on methimazole 10 mg twice daily, propranolol 20 mg twice daily tachycardia -Will need endocrinology referral for further work-up - IVF bolus    Hyponatremia -Sodium 129 on admission, resolved  Anemia microcytic -will check anemia panel  History of prematurity with blindness, history of PDA status post ligation -Chronic   Code Status: Full code DVT Prophylaxis:  enoxaparin (LOVENOX) injection 40 mg Start: 08/18/20 1000   Level of Care: Level of care: Progressive Family Communication: Discussed all imaging results, lab results, explained to the patient    Disposition Plan:     Status is: Observation  The patient remains OBS appropriate and will d/c before 2 midnights.  Dispo: The patient is from: Home              Anticipated d/c is to: Home              Patient currently is not medically stable to d/c.  Hopefully DC home in a.m. if no repeat seizures and tachycardia improved   Difficult to place patient No      Time Spent in minutes    Procedures:  CTA chest EEG  Consultants:   Neurology  Antimicrobials:   Anti-infectives (From admission, onward)   None          Medications  Scheduled Meds: . enoxaparin (LOVENOX) injection  40 mg Subcutaneous Daily  . levETIRAcetam  500 mg Oral BID  . methimazole  10 mg Oral BID  . propranolol  20 mg Oral BID   Continuous Infusions: PRN Meds:.acetaminophen **OR** acetaminophen      Subjective:   Janet Dennis was seen and examined today.  No new seizures, continues to have sinus tachycardia.  BP soft, was complaining of dizziness in a.m. Patient denies chest pain, shortness of breath, abdominal pain, N/V/D/C.   Objective:   Vitals:   08/18/20 1045 08/18/20 1130 08/18/20 1145 08/18/20 1225  BP: 110/64 100/62  119/69  Pulse: (!) 120 (!) 116  (!) 116   Resp: (!) 29 (!) 25  16  Temp:   98 F (36.7 C) 98.4 F (36.9 C)  TempSrc:   Oral Oral  SpO2: 100% 100%  100%  Weight:      Height:        Intake/Output Summary (Last 24 hours) at 08/18/2020 1438 Last data filed at 08/18/2020 1144 Gross per 24 hour  Intake 1441.67 ml  Output -  Net 1441.67 ml     Wt Readings from Last 3 Encounters:  08/17/20 59 kg  02/27/18 59.6 kg     Exam  General: Alert and oriented x 3, NAD  Cardiovascular: S1 S2 auscultated, sinus tachycardia  Respiratory: Clear to auscultation bilaterally, no wheezing, rales or rhonchi  Gastrointestinal: Soft, nontender, nondistended, + bowel sounds  Ext: no pedal edema bilaterally  Neuro: no new deficits  Musculoskeletal: No digital cyanosis, clubbing  Skin: No rashes  Psych: Normal affect and demeanor   Data Reviewed:  I have personally reviewed following labs and imaging studies  Micro Results Recent Results (from the past 240 hour(s))  Resp Panel by RT-PCR (Flu A&B, Covid) Nasopharyngeal Swab     Status: None   Collection Time: 08/17/20  8:25 PM   Specimen: Nasopharyngeal Swab; Nasopharyngeal(NP) swabs in vial transport medium  Result Value Ref Range Status   SARS Coronavirus 2 by RT PCR NEGATIVE NEGATIVE Final    Comment: (NOTE) SARS-CoV-2 target nucleic acids are NOT DETECTED.  The SARS-CoV-2 RNA is generally detectable in upper respiratory specimens during the acute phase of infection. The lowest concentration of SARS-CoV-2 viral copies this assay can detect is 138 copies/mL. A negative result does not preclude SARS-Cov-2 infection and should not be used as the sole basis for treatment or other patient management decisions. A negative result may occur with  improper specimen collection/handling, submission of specimen other than nasopharyngeal swab, presence of viral mutation(s) within the areas targeted by this assay, and inadequate number of viral copies(<138 copies/mL). A negative  result must be combined with clinical observations, patient history, and epidemiological information. The expected result is Negative.  Fact Sheet for Patients:  BloggerCourse.com  Fact Sheet for Healthcare Providers:  SeriousBroker.it  This test is no t yet approved or cleared by the [place] FDA and  has been authorized for detection and/or diagnosis of SARS-CoV-2 by FDA under an Emergency Use Authorization (EUA). This EUA will remain  in effect (meaning this test can be used) for the duration of the COVID-19 declaration under Section 564(b)(1) of the Act, 21 U.S.C.section 360bbb-3(b)(1), unless the authorization is terminated  or revoked sooner.  Influenza A by PCR NEGATIVE NEGATIVE Final   Influenza B by PCR NEGATIVE NEGATIVE Final    Comment: (NOTE) The Xpert Xpress SARS-CoV-2/FLU/RSV plus assay is intended as an aid in the diagnosis of influenza from Nasopharyngeal swab specimens and should not be used as a sole basis for treatment. Nasal washings and aspirates are unacceptable for Xpert Xpress SARS-CoV-2/FLU/RSV testing.  Fact Sheet for Patients: BloggerCourse.com  Fact Sheet for Healthcare Providers: SeriousBroker.it  This test is not yet approved or cleared by the [place] FDA and has been authorized for detection and/or diagnosis of SARS-CoV-2 by FDA under an Emergency Use Authorization (EUA). This EUA will remain in effect (meaning this test can be used) for the duration of the COVID-19 declaration under Section 564(b)(1) of the Act, 21 U.S.C. section 360bbb-3(b)(1), unless the authorization is terminated or revoked.  Performed at [facility], [address], [place], [place] [address]     Radiology Reports CT Angio Chest PE W and/or Wo Contrast  Result Date: 08/18/2020 CLINICAL DATA:  Convulsions EXAM: CT ANGIOGRAPHY CHEST WITH CONTRAST  TECHNIQUE: Multidetector CT imaging of the chest was performed using the standard protocol during bolus administration of intravenous contrast. Multiplanar CT image reconstructions and MIPs were obtained to evaluate the vascular anatomy. CONTRAST:  50mL OMNIPAQUE IOHEXOL 350 MG/ML SOLN COMPARISON:  08/17/2020 FINDINGS: Cardiovascular: This is a technically adequate evaluation of the pulmonary vasculature. No filling defects or pulmonary emboli. The heart is unremarkable without pericardial effusion. Normal caliber of the thoracic aorta without aneurysm or dissection. Mediastinum/Nodes: Prominent soft tissue within the anterior mediastinum compatible with thymus. No pathologic adenopathy. Thyroid, trachea, and esophagus are unremarkable. Lungs/Pleura: Scattered areas of scarring are seen at the lung bases and within the right upper lobe. No acute airspace disease, effusion, or pneumothorax. Central airways are patent. Upper Abdomen: No acute abnormality. Musculoskeletal: No acute or destructive bony lesions. Reconstructed images demonstrate no additional findings. Review of the MIP images confirms the above findings. IMPRESSION: 1. No evidence of pulmonary embolus. 2. Soft tissue within the anterior mediastinum, most consistent with prominent thymus. This is abnormal in a patient of this age, and could reflect hyperplasia or thymic rebound. 3. Otherwise no acute intrathoracic process. Electronically Signed   By: Sharlet Salina M.D.   On: 08/18/2020 00:00   MR BRAIN WO CONTRAST  Result Date: 08/18/2020 CLINICAL DATA:  Initial evaluation for acute seizure. History of prematurity, hearing loss, blindness. EXAM: MRI HEAD WITHOUT CONTRAST TECHNIQUE: Multiplanar, multiecho pulse sequences of the brain and surrounding structures were obtained without intravenous contrast. COMPARISON:  None. FINDINGS: Brain: Cerebral volume within normal limits for age. No abnormal foci of restricted diffusion to suggest acute or  subacute ischemia or changes related to acute status. Gray matter differentiation maintained. No encephalomalacia to suggest chronic cortical infarction. No foci of susceptibility artifact to suggest acute or chronic intracranial hemorrhage. No mass lesion, midline shift, or mass effect. No hydrocephalus or extra-axial fluid collection. Pituitary gland and suprasellar region normal. Midline structures intact. Subtly increased FLAIR signal abnormality seen about the right lateral ventricle, nonspecific, but suspected to reflect a degree of chronic PVL related to history of prematurity (series 12, image 20). Thin section imaging through the temporal lobes was performed. There is question of asymmetric atrophy of the right hippocampal formation as compared to the contralateral left (series 11, image 16). Associated asymmetric enlargement of the adjacent right temporal horn. Additionally, the internal architecture of the right hippocampus is subtly not as well defined  as compared to the contralateral left hippocampus. Suggestion of subtly increased FLAIR signal abnormality seen at this level as well (series 12, image 11). 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 A focal cortical dysplasia would be the primary differential consideration (series 12, image 16). Vascular: Major intracranial vascular flow voids are maintained. Skull and upper cervical spine: Craniocervical junction within normal limits. Bone marrow signal intensity normal. No scalp soft tissue abnormality. Sinuses/Orbits: Chronic changes noted at the globes bilaterally. Globes and orbital soft tissues demonstrate no acute finding. Scattered mucosal thickening noted throughout the visualized paranasal sinuses. Left maxillary sinus retention cyst partially visualized. Left mastoid air cells are underpneumatized. Right mastoid air cells are largely clear. Other: None. IMPRESSION: 1. Question asymmetric atrophy of the right  hippocampal formation with subtle loss of the normal internal hippocampal architecture and increased FLAIR signal abnormality. 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 A focal cortical dysplasia would be the primary differential consideration. Correlation with EEG recommended. 2. 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 3. No other acute intracranial abnormality. Electronically Signed   By: Rise MuBenjamin  McClintock M.D.   On: 08/18/2020 01:51   DG Chest Portable 1 View  Result Date: 08/17/2020 CLINICAL DATA:  Convulsions. EXAM: PORTABLE CHEST 1 VIEW COMPARISON:  None. FINDINGS: Two small, ill-defined opacities are seen overlying the upper right lung. There is no evidence of a pleural effusion or pneumothorax. The cardiac silhouette is mildly enlarged. A small radiopaque surgical clip is seen overlying the superior mediastinum on the left, just above the level of the hilum. The visualized skeletal structures are unremarkable. IMPRESSION: Small opacities overlying the upper right lung which may represent a small amount of atelectasis versus early infiltrate. Follow-up to resolution is recommended to exclude the presence of an underlying neoplastic process. Electronically Signed   By: Aram Candelahaddeus  Houston M.D.   On: 08/17/2020 20:47   EEG adult  Result Date: 08/18/2020 Charlsie QuestYadav, Priyanka O, MD     08/18/2020 11:34 AM Patient Name: Janet Dennis MRN: [id number] Epilepsy Attending: Charlsie QuestPriyanka O Yadav Referring Physician/Provider: Dr. Midge MiniumArshad Kakrakandy Date: 08/18/2020 Duration: 24.06 minutes Patient history: [age] year old female with first-time seizure-like episode.  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  EEG to evaluate for seizures. Level of alertness: Awake, asleep AEDs during EEG study: Keppra Technical aspects: This EEG study was done with  scalp electrodes positioned according to the 10-20 International system of electrode placement. Electrical activity was acquired at a sampling rate of 500Hz  and reviewed with a high frequency filter of 70Hz  and a low frequency filter of 1Hz . EEG data were recorded continuously and digitally stored. Description: The posterior dominant rhythm consists of 8-9 Hz activity of moderate voltage (25-35 uV) seen predominantly in posterior head regions, symmetric and reactive to eye opening and eye closing. Sleep was characterized by vertex waves, sleep spindles (12 to 14 Hz), maximal frontocentral region. Hyperventilation and photic stimulation were not performed.   IMPRESSION: This study is within normal limits. No seizures or epileptiform discharges were seen throughout the recording. Charlsie QuestPriyanka O Yadav    Lab Data:  CBC: Recent Labs  Lab 08/17/20 2025 08/18/20 0436  WBC 5.0 6.4  NEUTROABS 2.2 4.3  HGB 11.3* 10.5*  HCT 36.0 32.1*  MCV 79.6* 77.3*  PLT 173 286   Basic Metabolic Panel: Recent Labs  Lab 08/17/20 2025 08/18/20 0436 08/18/20 1258  NA 129* 140 142  K 5.2* 3.2* 3.8  CL 102 110 113*  CO2 17* 21* 23  GLUCOSE 72 116* 139*  BUN 15 9 7   CREATININE 0.65 0.61 0.53  CALCIUM 9.0 8.9 9.1  MG 1.8 1.6*  --    GFR: Estimated Creatinine Clearance: 81.5 mL/min (by C-G formula based on SCr of 0.53 mg/dL). Liver Function Tests: Recent Labs  Lab 08/18/20 0436  AST 26  ALT 29  ALKPHOS 76  BILITOT 1.5*  PROT 5.9*  ALBUMIN 2.8*   No results for input(s): LIPASE, AMYLASE in the last 168 hours. No results for input(s): AMMONIA in the last 168 hours. Coagulation Profile: No results for input(s): INR, PROTIME in the last 168 hours. Cardiac Enzymes: No results for input(s): CKTOTAL, CKMB, CKMBINDEX, TROPONINI in the last 168 hours. BNP (last 3 results) No results for input(s): PROBNP in the last 8760 hours. HbA1C: No results for input(s): HGBA1C in the last 72 hours. CBG: Recent Labs   Lab 08/17/20 2009  GLUCAP 69*   Lipid Profile: No results for input(s): CHOL, HDL, LDLCALC, TRIG, CHOLHDL, LDLDIRECT in the last 72 hours. Thyroid Function Tests: Recent Labs    08/17/20 2025  TSH <0.010*  FREET4 5.47*   Anemia Panel: No results for input(s): VITAMINB12, FOLATE, FERRITIN, TIBC, IRON, RETICCTPCT in the last 72 hours. Urine analysis:    Component Value Date/Time   COLORURINE STRAW (A) 08/18/2020 0017   APPEARANCEUR CLEAR 08/18/2020 0017   LABSPEC 1.016 08/18/2020 0017   PHURINE 5.0 08/18/2020 0017   GLUCOSEU NEGATIVE 08/18/2020 0017   HGBUR SMALL (A) 08/18/2020 0017   BILIRUBINUR NEGATIVE 08/18/2020 0017   KETONESUR 20 (A) 08/18/2020 0017   PROTEINUR NEGATIVE 08/18/2020 0017   NITRITE NEGATIVE 08/18/2020 0017   LEUKOCYTESUR NEGATIVE 08/18/2020 0017     Cregg Jutte M.D. [facility] 08/18/2020, 2:38 PM  Available via Epic secure chat 7am-7pm After 7 pm, please refer to night coverage provider listed on amion.

## 2020-08-18 NOTE — ED Provider Notes (Signed)
Discussed MRI findings with Dr. Marin Roberts He suggest admission to the hospital for EEG and to start Keppra.  This could be the focus of a seizure disorder.  Patient can also obtain a work-up for potential hyperthyroid.  Patient and mother were updated on plan.  Discussed with Dr. Toniann Fail for admission   Zadie Rhine, MD 08/18/20 [phone]

## 2020-08-19 DIAGNOSIS — E871 Hypo-osmolality and hyponatremia: Secondary | ICD-10-CM | POA: Diagnosis not present

## 2020-08-19 DIAGNOSIS — R569 Unspecified convulsions: Secondary | ICD-10-CM | POA: Diagnosis not present

## 2020-08-19 DIAGNOSIS — E0590 Thyrotoxicosis, unspecified without thyrotoxic crisis or storm: Secondary | ICD-10-CM | POA: Diagnosis not present

## 2020-08-19 LAB — THYROTROPIN RECEPTOR AUTOABS: Thyrotropin Receptor Ab: 3.28 IU/L — ABNORMAL HIGH (ref 0.00–1.75)

## 2020-08-19 MED ORDER — METHIMAZOLE 10 MG PO TABS: 10.0000 mg | ORAL_TABLET | Freq: Two times a day (BID) | ORAL | 2 refills | Status: DC

## 2020-08-19 MED ORDER — PROPRANOLOL HCL 20 MG PO TABS: 20.0000 mg | ORAL_TABLET | Freq: Two times a day (BID) | ORAL | 2 refills | Status: DC

## 2020-08-19 MED ORDER — LEVETIRACETAM 500 MG PO TABS: 500.0000 mg | ORAL_TABLET | Freq: Two times a day (BID) | ORAL | 2 refills | Status: DC

## 2020-08-19 NOTE — Plan of Care (Signed)
Patient is currently resting in bed. VSS. Remains on room air. OOB ambulating to BR. No complaints overnight. Call bell within reach. Bed alarm on.   Problem: Education: Goal: Expressions of having a comfortable level of knowledge regarding the disease process will increase Outcome: Progressing   Problem: Coping: Goal: Ability to adjust to condition or change in health will improve Outcome: Progressing Goal: Ability to identify appropriate support needs will improve Outcome: Progressing   Problem: Health Behavior/Discharge Planning: Goal: Compliance with prescribed medication regimen will improve Outcome: Progressing   Problem: Medication: Goal: Risk for medication side effects will decrease Outcome: Progressing   Problem: Clinical Measurements: Goal: Complications related to the disease process, condition or treatment will be avoided or minimized Outcome: Progressing Goal: Diagnostic test results will improve Outcome: Progressing   Problem: Safety: Goal: Verbalization of understanding the information provided will improve Outcome: Progressing   Problem: Self-Concept: Goal: Level of anxiety will decrease Outcome: Progressing Goal: Ability to verbalize feelings about condition will improve Outcome: Progressing

## 2020-08-19 NOTE — Discharge Summary (Signed)
Physician Discharge Summary   Patient ID: Janet Dennis MRN: [id number] DOB/AGE: 10/24/1980 40 y.o.  Admit date: 08/17/2020 Discharge date: 08/19/2020  Primary Care Physician:  Verlon Au, MD   Recommendations for Outpatient Follow-up:  1. Follow up with PCP in 1-2 weeks 2. Please follow TSH, T3, T4 at the follow-up appointment.  Patient started on methimazole 10 mg twice daily 3. Started on propranolol 20 mg twice daily 4. Outpatient referral to endocrinology sent. 5. Started on Keppra 500 mg twice daily, outpatient referral to neurology sent  Home Health: Patient at baseline Equipment/Devices:   Discharge Condition: stable  CODE STATUS: FULL  Diet recommendation: Regular diet   Discharge Diagnoses:     Seizure . Hyponatremia . Hyperthyroidism with signs of thyrotoxicosis, tachycardia, new diagnosis Hyponatremia Microcytic anemia   Consults: Neurology    Allergies:  No Known Allergies   DISCHARGE MEDICATIONS: Allergies as of 08/19/2020   No Known Allergies     Medication List    TAKE these medications   levETIRAcetam 500 MG tablet Commonly known as: KEPPRA Take 1 tablet (500 mg total) by mouth 2 (two) times daily.   methimazole 10 MG tablet Commonly known as: TAPAZOLE Take 1 tablet (10 mg total) by mouth 2 (two) times daily.   neomycin-polymyxin b-dexamethasone 3.5-10000-0.1 Oint Commonly known as: MAXITROL Place 1 application into both eyes in the morning and at bedtime.   neomycin-polymyxin b-dexamethasone 3.5-10000-0.1 Susp Commonly known as: MAXITROL Place 1 drop into both eyes in the morning and at bedtime.   propranolol 20 MG tablet Commonly known as: INDERAL Take 1 tablet (20 mg total) by mouth 2 (two) times daily.        Brief H and P: For complete details please refer to admission H and P, but in brief *Patient is a 40 year old female with history of prematurity, blindness, PDA status post ligation was brought to ED  after patient's mother witnessed a seizure episode at home.  Patient was sitting in the bed just finished talking on the phone, found to have a staring spell followed by foaming at the mouth and seizure-like episode.  On EMS arrival, patient was confused.  In ED, patient became oriented and did not recall the incident.  Patient has been having some hot flashes and weight loss recently.  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  Neurology was consulted, patient was given loading dose of Keppra, followed by Keppra 500 mg twice daily. She was persistently tachycardic.  CTA chest was negative for PE but did show soft tissue in the anterior mediastinum most consistent with prominent thymus. TSH <0.1, free T4 5.47, consistent with hyperthyroidism.  Patient was admitted for further work-up  Hospital Course:   Seizure [facility]) -MRI brain findings raise the possibility for changes of mesial temporal sclerosis -Neurology was consulted.  Patient was placed on Keppra 500 mg p.o. twice daily after loading dose -No repeat seizures -EEG showed no seizure or epileptiform discharges   Hyperthyroidism with thyrotoxicosis, tachycardia, new diagnosis -CTA chest showed prominent soft tissue within the anterior mediastinum compatible with thymus, no pathology adenopathy.  Thyroid, trachea and esophagus unremarkable -TSH < 0.01, free T4 5.47, thyrotropin receptor antibodies 3.28, elevated, possibly underlying Graves' disease -Placed on methimazole 10 mg twice daily, propranolol 20 mg twice daily tachycardia -Ambulatory referral to endocrinology sent. -Per mother at the bedside, patient's father also has hyperthyroidism and follows endocrinology, she will contact to make a follow-up appointment  Hyponatremia -Sodium 129 on  admission, resolved Sodium 142 at the time of discharge.  Anemia microcytic -Follow outpatient with PCP.  Hemoglobin 10.5 at the time of discharge, MCV  77.3  History of prematurity with blindness, history of PDA status post ligation -Chronic   Day of Discharge S: No repeat seizures, no acute complaints.  Mother at the bedside.  Heart rate improving  BP 127/80 (BP Location: Left Arm)   Pulse 99   Temp 98.6 F (37 C) (Oral)   Resp 18   Ht  (1.626 m)   Wt 59 kg   SpO2 100%   BMI 22.31 kg/m   Physical Exam: General: Alert and awake oriented x3 not in any acute distress. CVS: S1-S2 clear no murmur rubs or gallops, tachycardia Chest: clear to auscultation bilaterally, no wheezing rales or rhonchi Abdomen: soft nontender, nondistended, normal bowel sounds Extremities: no cyanosis, clubbing or edema noted bilaterally     Get Medicines reviewed and adjusted: Please take all your medications with you for your next visit with your Primary MD  Please request your Primary MD to go over all hospital tests and procedure/radiological results at the follow up. Please ask your Primary MD to get all Hospital records sent to his/her office.  If you experience worsening of your admission symptoms, develop shortness of breath, life threatening emergency, suicidal or homicidal thoughts you must seek medical attention immediately by calling 911 or calling your MD immediately  if symptoms less severe.  You must read complete instructions/literature along with all the possible adverse reactions/side effects for all the Medicines you take and that have been prescribed to you. Take any new Medicines after you have completely understood and accept all the possible adverse reactions/side effects.   Do not drive when taking pain medications.   Do not take more than prescribed Pain, Sleep and Anxiety Medications  Special Instructions: If you have smoked or chewed Tobacco  in the last 2 yrs please stop smoking, stop any regular Alcohol  and or any Recreational drug use.  Wear Seat belts while driving.  Please note  You were cared for by a  hospitalist during your hospital stay. Once you are discharged, your primary care physician will handle any further medical issues. Please note that NO REFILLS for any discharge medications will be authorized once you are discharged, as it is imperative that you return to your primary care physician (or establish a relationship with a primary care physician if you do not have one) for your aftercare needs so that they can reassess your need for medications and monitor your lab values.   The results of significant diagnostics from this hospitalization (including imaging, microbiology, ancillary and laboratory) are listed below for reference.      Procedures/Studies:  CT Angio Chest PE W and/or Wo Contrast  Result Date: 08/18/2020 CLINICAL DATA:  Convulsions EXAM: CT ANGIOGRAPHY CHEST WITH CONTRAST TECHNIQUE: Multidetector CT imaging of the chest was performed using the standard protocol during bolus administration of intravenous contrast. Multiplanar CT image reconstructions and MIPs were obtained to evaluate the vascular anatomy. CONTRAST:  50mL OMNIPAQUE IOHEXOL 350 MG/ML SOLN COMPARISON:  08/17/2020 FINDINGS: Cardiovascular: This is a technically adequate evaluation of the pulmonary vasculature. No filling defects or pulmonary emboli. The heart is unremarkable without pericardial effusion. Normal caliber of the thoracic aorta without aneurysm or dissection. Mediastinum/Nodes: Prominent soft tissue within the anterior mediastinum compatible with thymus. No pathologic adenopathy. Thyroid, trachea, and esophagus are unremarkable. Lungs/Pleura: Scattered areas of scarring are seen at the  lung bases and within the right upper lobe. No acute airspace disease, effusion, or pneumothorax. Central airways are patent. Upper Abdomen: No acute abnormality. Musculoskeletal: No acute or destructive bony lesions. Reconstructed images demonstrate no additional findings. Review of the MIP images confirms the above  findings. IMPRESSION: 1. No evidence of pulmonary embolus. 2. Soft tissue within the anterior mediastinum, most consistent with prominent thymus. This is abnormal in a patient of this age, and could reflect hyperplasia or thymic rebound. 3. Otherwise no acute intrathoracic process. Electronically Signed   By: Sharlet Salina M.D.   On: 08/18/2020 00:00   MR BRAIN WO CONTRAST  Result Date: 08/18/2020 CLINICAL DATA:  Initial evaluation for acute seizure. History of prematurity, hearing loss, blindness. EXAM: MRI HEAD WITHOUT CONTRAST TECHNIQUE: Multiplanar, multiecho pulse sequences of the brain and surrounding structures were obtained without intravenous contrast. COMPARISON:  None. FINDINGS: Brain: Cerebral volume within normal limits for age. No abnormal foci of restricted diffusion to suggest acute or subacute ischemia or changes related to acute status. Gray matter differentiation maintained. No encephalomalacia to suggest chronic cortical infarction. No foci of susceptibility artifact to suggest acute or chronic intracranial hemorrhage. No mass lesion, midline shift, or mass effect. No hydrocephalus or extra-axial fluid collection. Pituitary gland and suprasellar region normal. Midline structures intact. Subtly increased FLAIR signal abnormality seen about the right lateral ventricle, nonspecific, but suspected to reflect a degree of chronic PVL related to history of prematurity (series 12, image 20). Thin section imaging through the temporal lobes was performed. There is question of asymmetric atrophy of the right hippocampal formation as compared to the contralateral left (series 11, image 16). Associated asymmetric enlargement of the adjacent right temporal horn. Additionally, the internal architecture of the right hippocampus is subtly not as well defined as compared to the contralateral left hippocampus. Suggestion of subtly increased FLAIR signal abnormality seen at this level as well (series 12, image  11). 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 A focal cortical dysplasia would be the primary differential consideration (series 12, image 16). Vascular: Major intracranial vascular flow voids are maintained. Skull and upper cervical spine: Craniocervical junction within normal limits. Bone marrow signal intensity normal. No scalp soft tissue abnormality. Sinuses/Orbits: Chronic changes noted at the globes bilaterally. Globes and orbital soft tissues demonstrate no acute finding. Scattered mucosal thickening noted throughout the visualized paranasal sinuses. Left maxillary sinus retention cyst partially visualized. Left mastoid air cells are underpneumatized. Right mastoid air cells are largely clear. Other: None. IMPRESSION: 1. 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 A focal cortical dysplasia would be the primary differential consideration. Correlation with EEG recommended. 2. 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 3. No other acute intracranial abnormality. Electronically Signed   By: Rise Mu M.D.   On: 08/18/2020 01:51   DG Chest Portable 1 View  Result Date: 08/17/2020 CLINICAL DATA:  Convulsions. EXAM: PORTABLE CHEST 1 VIEW COMPARISON:  None. FINDINGS: Two small, ill-defined opacities are seen overlying the upper right lung. There is no evidence of a pleural effusion or pneumothorax. The cardiac silhouette is mildly enlarged. A small radiopaque surgical clip is seen overlying the superior mediastinum on the left, just above the level of the hilum. The visualized skeletal  structures are unremarkable. IMPRESSION: Small opacities  overlying the upper right lung which may represent a small amount of atelectasis versus early infiltrate. Follow-up to resolution is recommended to exclude the presence of an underlying neoplastic process. Electronically Signed   By: Aram Candelahaddeus  Houston M.D.   On: 08/17/2020 20:47   EEG adult  Result Date: 08/18/2020 Charlsie QuestYadav, Priyanka O, MD     08/18/2020 11:34 AM Patient Name: Janet Dennis MRN: [id number] Epilepsy Attending: Charlsie QuestPriyanka O Yadav Referring Physician/Provider: Dr. Midge MiniumArshad Kakrakandy Date: 08/18/2020 Duration: 24.06 minutes Patient history: 40 year old female with first-time seizure-like episode.  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  EEG to evaluate for seizures. Level of alertness: Awake, asleep AEDs during EEG study: Keppra Technical aspects: This EEG study was done with scalp electrodes positioned according to the 10-20 International system of electrode placement. Electrical activity was acquired at a sampling rate of 500Hz  and reviewed with a high frequency filter of 70Hz  and a low frequency filter of 1Hz . EEG data were recorded continuously and digitally stored. Description: The posterior dominant rhythm consists of 8-9 Hz activity of moderate voltage (25-35 uV) seen predominantly in posterior head regions, symmetric and reactive to eye opening and eye closing. Sleep was characterized by vertex waves, sleep spindles (12 to 14 Hz), maximal frontocentral region. Hyperventilation and photic stimulation were not performed.   IMPRESSION: This study is within normal limits. No seizures or epileptiform discharges were seen throughout the recording. Priyanka Annabelle Harman Yadav       LAB RESULTS: Basic Metabolic Panel: Recent Labs  Lab 08/18/20 0436 08/18/20 1258  NA 140 142  K 3.2* 3.8  CL 110 113*  CO2 21* 23  GLUCOSE 116* 139*  BUN 9 7  CREATININE 0.61 0.53  CALCIUM 8.9 9.1  MG 1.6*  --    Liver Function Tests: Recent Labs  Lab 08/18/20 0436  AST 26  ALT 29  ALKPHOS  76  BILITOT 1.5*  PROT 5.9*  ALBUMIN 2.8*   No results for input(s): LIPASE, AMYLASE in the last 168 hours. No results for input(s): AMMONIA in the last 168 hours. CBC: Recent Labs  Lab 08/17/20 2025 08/18/20 0436  WBC 5.0 6.4  NEUTROABS 2.2 4.3  HGB 11.3* 10.5*  HCT 36.0 32.1*  MCV 79.6* 77.3*  PLT 173 286   Cardiac Enzymes: No results for input(s): CKTOTAL, CKMB, CKMBINDEX, TROPONINI in the last 168 hours. BNP: Invalid input(s): POCBNP CBG: Recent Labs  Lab 08/17/20 2009  GLUCAP 69*       Disposition and Follow-up: Discharge Instructions    Ambulatory referral to Endocrinology   Complete by: As directed    New diagnosis of Graves ds presented with thyrotoxicosis   Ambulatory referral to Neurology   Complete by: As directed    An appointment is requested in approximately: 2-3 wks   Diet general   Complete by: As directed    Discharge instructions   Complete by: As directed    Discharge instructions:  Patient advised of seizure precautions as follows: [place] statutes, patients with seizures are not allowed to drive until they have been seizure-free for six months. Use caution when using heavy equipment or power tools. Avoid working on ladders or at heights. Take showers instead of baths. Ensure the water temperature is not too high on the home water heater. Do not go swimming alone. When caring for infants or small children, sit down when holding, feeding, or changing them to minimize risk of injury to the  child in the event you have a seizure.   Also, Maintain good sleep hygiene. Avoid alcohol.  -->Call 911 and bring the patient back to the ED if:  A. The seizure lasts longer than 5 minutes.  B. The patient doesn't awaken shortly after the seizure C. The patient has new problems such as difficulty seeing, speaking or moving D. The patient was injured during the seizure E.  The patient has a temperature over 102 F (39C) F. The patient vomited and now is having trouble breathing   Increase activity slowly   Complete by: As directed        DISPOSITION: Home   DISCHARGE FOLLOW-UP  Follow-up Information    Verlon Au, MD. Schedule an appointment as soon as possible for a visit in 2 week(s).   Specialty: Family Medicine Contact information: [address] [place] [address] [phone]                Time coordinating discharge:  35 minutes  Signed:   Thad Ranger M.D. [facility] 08/19/2020, 10:55 AM

## 2020-08-20 LAB — T3, FREE: T3, Free: 7.1 pg/mL — ABNORMAL HIGH (ref 2.0–4.4)

## 2020-10-18 ENCOUNTER — Encounter: Payer: Self-pay | Admitting: Endocrinology

## 2020-10-18 ENCOUNTER — Other Ambulatory Visit: Payer: Self-pay

## 2020-10-18 ENCOUNTER — Ambulatory Visit (INDEPENDENT_AMBULATORY_CARE_PROVIDER_SITE_OTHER): Payer: BC Managed Care – PPO | Admitting: Endocrinology

## 2020-10-18 VITALS — BP 118/80 | HR 67 | Ht 64.0 in | Wt 137.6 lb

## 2020-10-18 DIAGNOSIS — E0590 Thyrotoxicosis, unspecified without thyrotoxic crisis or storm: Secondary | ICD-10-CM | POA: Diagnosis not present

## 2020-10-18 LAB — TSH: TSH: 105.13 u[IU]/mL — ABNORMAL HIGH (ref 0.35–4.50)

## 2020-10-18 LAB — T4, FREE: Free T4: 0 ng/dL — ABNORMAL LOW (ref 0.60–1.60)

## 2020-10-18 MED ORDER — PROPRANOLOL HCL 10 MG PO TABS: 10.0000 mg | ORAL_TABLET | Freq: Two times a day (BID) | ORAL | 3 refills | Status: DC

## 2020-10-18 NOTE — Patient Instructions (Signed)
Blood tests are requested for you today.  We'll let you know about the results.  Please reduce the propranolol to 10 mg twice a day. If ever you have fever while taking methimazole, stop it and call us, even if the reason is obvious, because of the risk of a rare side-effect. It is best to never miss the methimazole.  However, if you do miss it, next best is to double up the next time.

## 2020-10-18 NOTE — Progress Notes (Signed)
Subjective:    Patient ID: Janet Dennis, female    DOB: 1980/07/08, 40 y.o.   MRN: [id number]  HPI Pt is referred by Dr Isidoro Donning, for hyperthyroidism.  Pt reports he was dx'ed with hyperthyroidism in early 2022.  She was rx'ed tapazole and b-blocker.  she has never had XRT to the anterior neck, or thyroid surgery.  she has never had thyroid imaging.  she does not consume kelp or any other non-prescribed thyroid medication.  she has never been on amiodarone.  Since on rx, pt states she feels better in general.   Past Medical History:  Diagnosis Date  . Bilateral hearing loss   . Blind 1982  . 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 Premature baby     Past Surgical History:  Procedure Laterality Date  . WISDOM TOOTH EXTRACTION      Social History   Socioeconomic History  . Marital status: Single    Spouse name: Not on file  . Number of children: Not on file  . Years of education: Not on file  . Highest education level: Not on file  Occupational History  . Not on file  Tobacco Use  . Smoking status: Never Smoker  . Smokeless tobacco: Never Used  Vaping Use  . Vaping Use: Never used  Substance and Sexual Activity  . Alcohol use: Yes    Comment: occ  . Drug use: Never  . Sexual activity: Never    Birth control/protection: None  Other Topics Concern  . Not on file  Social History Narrative  . Not on file   Social Determinants of Health   Financial Resource Strain: Not on file  Food Insecurity: Not on file  Transportation Needs: Not on file  Physical Activity: Not on file  Stress: Not on file  Social Connections: Not on file  Intimate Partner Violence: Not on file    Current Outpatient Medications on File Prior to Visit  Medication Sig Dispense Refill  . levETIRAcetam (KEPPRA) 500 MG tablet Take 1 tablet (500 mg total) by mouth 2 (two) times daily. 60 tablet 2  . neomycin-polymyxin b-dexamethasone (MAXITROL) 3.5-10000-0.1 OINT Place 1 application into both eyes  in the morning and at bedtime.    Marland Kitchen neomycin-polymyxin b-dexamethasone (MAXITROL) 3.5-10000-0.1 SUSP Place 1 drop into both eyes in the morning and at bedtime.     No current facility-administered medications on file prior to visit.    No Known Allergies  Family History  Problem Relation Age of Onset  . Thyroid disease Father     BP 118/80 (BP Location: Right Arm, Patient Position: Sitting, Cuff Size: Normal)   Pulse 67   Ht 5\' 4"  (1.626 m)   Wt 137 lb 9.6 oz (62.4 kg)   SpO2 99%   BMI 23.62 kg/m     Review of Systems denies weight loss, palpitations, sob, excessive diaphoresis, tremor, anxiety, and heat intolerance.      Objective:   Physical Exam VS: see vs page GEN: no distress HEAD: head: no deformity.  bilat HA's eyes: no periorbital swelling, no proptosis external nose and ears are normal NECK: thyroid is 10 times normal size on the right, and normal on the left.     CHEST WALL: no deformity LUNGS: clear to auscultation.   CV: reg rate and rhythm, no murmur.  MUSCULOSKELETAL: gait is steady, with a cane.   EXTEMITIES: no deformity.  no leg edema NEURO:  readily moves all 4's.  sensation  is intact to touch on all 4's.  No tremor.  SKIN:  Normal texture and temperature.  No rash or suspicious lesion is visible.  Not diaphoretic.  NODES:  None palpable at the neck.   PSYCH: alert, well-oriented.  Does not appear anxious nor depressed.   Lab Results  Component Value Date   TSH <0.010 (L) 08/17/2020   I have reviewed outside records, and summarized: Pt was noted to have low TSH, and referred here.  She was admitted 3/22 with sz, and was rx'ed with tapazole/b-blocker     Assessment & Plan:  Hyperthyroidism, new to me, uncontrolled   Patient Instructions  Blood tests are requested for you today.  We'll let you know about the results.  Please reduce the propranolol to 10 mg twice a day. If ever you have fever while taking methimazole, stop it and call us,  even if the reason is obvious, because of the risk of a rare side-effect. It is best to never miss the methimazole.  However, if you do miss it, next best is to double up the next time.

## 2020-11-03 ENCOUNTER — Encounter: Payer: Self-pay | Admitting: Diagnostic Neuroimaging

## 2020-11-03 ENCOUNTER — Ambulatory Visit (INDEPENDENT_AMBULATORY_CARE_PROVIDER_SITE_OTHER): Payer: BC Managed Care – PPO | Admitting: Diagnostic Neuroimaging

## 2020-11-03 VITALS — BP 124/88 | HR 81 | Ht 61.0 in | Wt 135.0 lb

## 2020-11-03 DIAGNOSIS — R569 Unspecified convulsions: Secondary | ICD-10-CM

## 2020-11-03 MED ORDER — LEVETIRACETAM 500 MG PO TABS: 500.0000 mg | ORAL_TABLET | Freq: Two times a day (BID) | ORAL | 4 refills | Status: DC

## 2020-11-03 NOTE — Progress Notes (Signed)
[facility]  PATIENT: Janet Dennis DOB: Jun 23, 1980  REFERRING CLINICIAN: Verlon Au, MD HISTORY FROM: patient  REASON FOR VISIT: new consult    HISTORICAL  CHIEF COMPLAINT:  Chief Complaint  Patient presents with  . Seizures    Rm 7 New Pt   father- Jonny Ruiz  "first seizure March"     HISTORY OF PRESENT ILLNESS:   UPDATE (11/03/20, VRP): 40 year old female with history of premature birth and blindness, here for evaluation of seizure.  Patient was the hospital in March 2022 for new onset seizure.  Due to history of premature birth, intracerebral hemorrhage, abnormal MRI of the brain she was started on antiseizure medication after this new onset seizure.  Since that time patient doing well.  No side effects of medication.  PRIOR HPI (08/18/20, Dr. Derry Lory): "Janet Dennis is a 40 y.o. 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 Patient's mother witnessed much of the episode.  Patient reports that she just got out of the shower (was sitting in the bed that the last thing that she remembers.  Patient wants report that she heard the patient get on the phone and noted that she was sitting at the edge of the bed with her head down.  She called out her name with no response, asked if she was doing okay with no response.  She noted that patient's hands rose up and was very stiff and she made a weird noise with some foaming at her mouth.  She immediately got the patient and helped her down to the ground and lay her on her side and by that time the event was over and patient appeared to be in deep sleep.  She immediately called EMS who arrived with a couple minutes and noted the patient was somnolent and not following any commands.  She was brought into the ED and was waking up more in the ED.  The entirety of the event lasted about somewhere between 1 to 2 minutes.  Patient has no  recollection of the event.  Patient denies any recent signs or symptoms of an upper respiratory infection or UTI or gastroenteritis.  Endorses unintentional weight loss with hot flashes over the last several weeks along with sweating.  Denies any prior history of CNS infection, no history of strokes, no significant head injury with loss of consciousness, denies any recent sleep deprivation, no significant alcohol intake, does not use any recreational drugs.  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   ALLERGIES: No Known Allergies  HOME MEDICATIONS: Outpatient Medications Prior to Visit  Medication Sig Dispense Refill  . levETIRAcetam (KEPPRA) 500 MG tablet Take 1 tablet (500 mg total) by mouth 2 (two) times daily. 60 tablet 2  . methimazole (TAPAZOLE) 5 MG tablet Take 5 mg by mouth 3 (three) times daily. 11/03/20 dose unknown    . propranolol (INDERAL) 10 MG tablet Take 1 tablet (10 mg total) by mouth 2 (two) times daily. 180 tablet 3  . neomycin-polymyxin b-dexamethasone (MAXITROL) 3.5-10000-0.1 OINT Place 1 application into both eyes in the morning and at bedtime. (Patient not taking:  Reported on 11/03/2020)    . neomycin-polymyxin b-dexamethasone (MAXITROL) 3.5-10000-0.1 SUSP Place 1 drop into both eyes in the morning and at bedtime. (Patient not taking: Reported on 11/03/2020)     No facility-administered medications prior to visit.    PAST MEDICAL HISTORY: Past Medical History:  Diagnosis Date  . Bilateral hearing loss   . Blind 1982  . 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 Premature baby   . Seizures (HCC) 08/2020     PAST SURGICAL HISTORY: Past Surgical History:  Procedure Laterality Date  . PATENT DUCTUS ARTERIOUS REPAIR     as infant  . SCAR REVISION    . WISDOM TOOTH EXTRACTION      FAMILY HISTORY: Family History  Problem Relation Age of Onset  . Thyroid disease Father     SOCIAL HISTORY: Social History   Socioeconomic History  . Marital status: Single    Spouse name: Not on file  . Number of children: 0  . Years of education: Not on file  . Highest education level: Associate degree: academic program  Occupational History  . Not on file  Tobacco Use  . Smoking status: Never Smoker  . Smokeless tobacco: Never Used  Vaping Use  . Vaping Use: Never used  Substance and Sexual Activity  . Alcohol use: Yes    Comment: occ  . Drug use: Never  . Sexual activity: Never    Birth control/protection: None  Other Topics Concern  . Not on file  Social History Narrative   Lives with family   Social Determinants of Health   Financial Resource Strain: Not on file  Food Insecurity: Not on file  Transportation Needs: Not on file  Physical Activity: Not on file  Stress: Not on file  Social Connections: Not on file  Intimate Partner Violence: Not on file     PHYSICAL EXAM  GENERAL EXAM/CONSTITUTIONAL: Vitals:  Vitals:   11/03/20 0833  BP: 124/88  Pulse: 81  Weight: 135 lb (61.2 kg)  Height: 5\' 1"  (1.549 m)     Body mass index is 25.51 kg/m. Wt Readings from Last 3 Encounters:  11/03/20 135 lb (61.2 kg)  10/18/20 137 lb 9.6 oz (62.4 kg)  08/17/20 130 lb (59 kg)     Patient is in no distress; well developed, nourished and groomed; neck is supple  CARDIOVASCULAR:  Examination of carotid arteries is normal; no carotid bruits  Regular rate and rhythm, no murmurs  Examination of peripheral vascular system by observation and palpation is normal  EYES:  Ophthalmoscopic exam of optic discs and posterior segments is normal; no papilledema or hemorrhages  No exam  data present  MUSCULOSKELETAL:  Gait, strength, tone, movements noted in Neurologic exam below  NEUROLOGIC: MENTAL STATUS:  No flowsheet data found.  awake, alert, oriented to person, place and time  recent and remote memory intact  normal attention and concentration  language fluent, comprehension intact, naming intact  fund of knowledge appropriate  CRANIAL NERVE:   2nd - SCAR TISSUE OVER BILATERAL CORNEA  2nd, 3rd, 4th, 6th - BLIND; PUPILS NOT VISUALIZED  5th - facial sensation symmetric  7th - facial strength symmetric  8th - hearing intact  9th - palate elevates symmetrically, uvula midline  11th - shoulder shrug symmetric  12th - tongue protrusion midline  MOTOR:   normal bulk and tone, full strength in the BUE, BLE  SENSORY:   normal and symmetric to light touch, temperature, vibration  COORDINATION:  finger-nose-finger, fine finger movements normal  REFLEXES:   deep tendon reflexes TRACE and symmetric  GAIT/STATION:   narrow based gait     DIAGNOSTIC DATA (LABS, IMAGING, TESTING) - I reviewed patient records, labs, notes, testing and imaging myself where available.  Lab Results  Component Value Date   WBC 6.4 08/18/2020   HGB 10.5 (L) 08/18/2020   HCT 32.1 (L) 08/18/2020   MCV 77.3 (L) 08/18/2020   PLT 286 08/18/2020      Component Value Date/Time   NA 142 08/18/2020 1258   K 3.8 08/18/2020 1258   CL 113 (H) 08/18/2020 1258   CO2 23 08/18/2020 1258   GLUCOSE 139 (H) 08/18/2020 1258   BUN 7 08/18/2020 1258   CREATININE 0.53 08/18/2020 1258   CALCIUM 9.1 08/18/2020 1258   PROT 5.9 (L) 08/18/2020 0436   ALBUMIN 2.8 (L) 08/18/2020 0436   AST 26 08/18/2020 0436   ALT 29 08/18/2020 0436   ALKPHOS 76 08/18/2020 0436   BILITOT 1.5 (H) 08/18/2020 0436   GFRNONAA >60 08/18/2020 1258   No results found for: CHOL, HDL, LDLCALC, LDLDIRECT, TRIG, CHOLHDL No results found for: LTJQ3E No results found for: VITAMINB12 Lab Results   Component Value Date   TSH 105.13 (H) 10/18/2020       ASSESSMENT AND PLAN  40 y.o. year old female here with:   Dx:  1. Seizure (HCC)     PLAN:  NEW ONSET SEIZURE (h/o prematurity, intracerebral hemorrhage, mesial temporal sclerosis; also new diagnosis of hyperthyroidism)  - agree to continue levetiracetam 500mg  twice a day   - According to [place] law, you can not drive unless you are seizure / syncope free for at least 6 months and under physician's care. 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 Do not participate in activities where a loss of awareness could harm you or someone else. No swimming alone, no tub bathing, no hot tubs, no driving, no operating motorized vehicles (cars, ATVs, motocycles, etc), lawnmowers, power tools or firearms. No standing at heights, such as rooftops, ladders or stairs. Avoid hot objects such as stoves, heaters, open fires. Wear a helmet when riding a bicycle, scooter, skateboard, etc. and avoid areas of traffic. Set your water heater to 120 degrees or less.   Meds ordered this encounter  Medications  . levETIRAcetam (KEPPRA) 500 MG tablet    Sig: Take 1 tablet (500 mg total) by mouth 2 (two) times daily.    Dispense:  180 tablet    Refill:  4   Return in about 8 months (around 07/06/2021).  I reviewed images, labs, notes, records myself. I summarized findings and reviewed with patient, for this high risk condition (seizure) requiring high complexity decision making.     09/03/2021, MD 11/03/2020, 9:06 AM Certified in Neurology, Neurophysiology and Neuroimaging  [facility] [address], [address] [place], [place] [phone]

## 2020-11-03 NOTE — Patient Instructions (Signed)
NEW ONSET SEIZURE (h/o prematurity, intracerebral hemorrhage, mesial temporal sclerosis; also new diagnosis of hyperthyroidism)  - continue levetiracetam 500mg  twice a day   - According to [place] law, you can not drive unless you are seizure / syncope free for at least 6 months and under physician's care. 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 Do not participate in activities where a loss of awareness could harm you or someone else. No swimming alone, no tub bathing, no hot tubs, no driving, no operating motorized vehicles (cars, ATVs, motocycles, etc), lawnmowers, power tools or firearms. No standing at heights, such as rooftops, ladders or stairs. Avoid hot objects such as stoves, heaters, open fires. Wear a helmet when riding a bicycle, scooter, skateboard, etc. and avoid areas of traffic. Set your water heater to 120 degrees or less.

## 2020-11-16 ENCOUNTER — Telehealth: Payer: Self-pay | Admitting: Endocrinology

## 2020-11-16 NOTE — Telephone Encounter (Signed)
Pt called to request we send in a refill for her Propranolol 10 MG to :  [facility] - [place], [place] - [address] Phone:  [phone]  Fax:  [phone]      Pt also would like to know if she should still keep taking her Methimazole? [phone].

## 2020-11-16 NOTE — Telephone Encounter (Signed)
MEDICATION: methimazole (TAPAZOLE) 5 MG tablet  PHARMACY:  [facility] [address]  IS PATIENT OUT OF MEDICATION: yes and is leaving town on 11/17/20  LAST APPOINTMENT DATE: @6 /14/2022  DO WE HAVE YOUR PERMISSION TO LEAVE A DETAILED MESSAGE?:  yes

## 2020-11-17 NOTE — Telephone Encounter (Signed)
Patient called to advise that they are now out of town.  Please send RX to [facility] on [address] [place] - Patient took last dose today.  Call at [phone]

## 2020-11-17 NOTE — Telephone Encounter (Signed)
Called and advised pt Per Dr Everardo All: Thyroid has gone low.  Please stop taking the methimazole, and recheck theblood tests in approx 2 weeks. We'll let you know about the results. No refill sent. Pt needs lab appt in 2 weeks

## 2020-12-08 ENCOUNTER — Telehealth: Payer: Self-pay | Admitting: Endocrinology

## 2020-12-08 NOTE — Telephone Encounter (Signed)
Called and lvm for medication , per pt chart pt has 3 refills on  her propranolol  [facility] . Pt wants to know is she needs to keep taking medication , please advise

## 2020-12-08 NOTE — Telephone Encounter (Signed)
Pt called again to request a refill on her propranolol. Please see below.

## 2020-12-08 NOTE — Telephone Encounter (Signed)
REFILL REQUEST -   Propranolol  PHARMACY -   [facility] - [place], [place] - [address]  [address] [place] [address]  Phone:  [phone]  Fax:  [phone]

## 2020-12-13 ENCOUNTER — Other Ambulatory Visit: Payer: Self-pay

## 2020-12-13 ENCOUNTER — Other Ambulatory Visit: Payer: Self-pay | Admitting: Endocrinology

## 2020-12-13 ENCOUNTER — Other Ambulatory Visit (INDEPENDENT_AMBULATORY_CARE_PROVIDER_SITE_OTHER): Payer: BC Managed Care – PPO

## 2020-12-13 DIAGNOSIS — E0590 Thyrotoxicosis, unspecified without thyrotoxic crisis or storm: Secondary | ICD-10-CM | POA: Diagnosis not present

## 2020-12-13 LAB — T4, FREE: Free T4: 1.99 ng/dL — ABNORMAL HIGH (ref 0.60–1.60)

## 2020-12-13 LAB — TSH: TSH: 0.08 u[IU]/mL — ABNORMAL LOW (ref 0.35–5.50)

## 2020-12-13 MED ORDER — METHIMAZOLE 10 MG PO TABS: 10.0000 mg | ORAL_TABLET | Freq: Every day | ORAL | 0 refills | Status: DC

## 2020-12-13 NOTE — Telephone Encounter (Signed)
This patient was seen 10/18/20 for her initial visit with DR Everardo All - He prescribed this medication at that time for a 90 supply with 3 refills -  a return visit is not indicated at this point. Came in to office on 12/13/20 for lab draw  Please send medication to [facility] on [address]

## 2020-12-15 ENCOUNTER — Telehealth: Payer: Self-pay

## 2020-12-15 DIAGNOSIS — E0590 Thyrotoxicosis, unspecified without thyrotoxic crisis or storm: Secondary | ICD-10-CM

## 2020-12-15 MED ORDER — PROPRANOLOL HCL 10 MG PO TABS: 10.0000 mg | ORAL_TABLET | Freq: Two times a day (BID) | ORAL | 3 refills | Status: DC

## 2020-12-15 NOTE — Telephone Encounter (Signed)
Pharmacy does not have the Propranolol - they never received. Please send RX for Propranolol to :  *THIS HAS BEEN [facility] SINCE 10/2020     [facility]  [address] [place] [address]  Phone:  [phone]  Fax:  [phone]

## 2020-12-15 NOTE — Telephone Encounter (Signed)
Sent refill to the pharmacy  

## 2020-12-17 MED ORDER — PROPRANOLOL HCL 10 MG PO TABS: 10.0000 mg | ORAL_TABLET | Freq: Two times a day (BID) | ORAL | 3 refills | Status: DC

## 2020-12-17 NOTE — Telephone Encounter (Signed)
Pt called to report the pharmacy does not have her prescription. When looking at the prescription it says "No Print" Please resend prescription to : [facility] [address] [place] - [address] Phone:  [phone]  Fax:  [phone]

## 2020-12-17 NOTE — Telephone Encounter (Signed)
Rx was resent to pharmacy  

## 2020-12-20 ENCOUNTER — Telehealth: Payer: Self-pay

## 2020-12-20 DIAGNOSIS — E0590 Thyrotoxicosis, unspecified without thyrotoxic crisis or storm: Secondary | ICD-10-CM

## 2020-12-20 MED ORDER — PROPRANOLOL HCL 10 MG PO TABS: 10.0000 mg | ORAL_TABLET | Freq: Two times a day (BID) | ORAL | 3 refills | Status: DC

## 2020-12-22 NOTE — Telephone Encounter (Signed)
Please send propanolol to [facility] on [address].  The RX sent yesterday shows as NO PRINT and patient is out of medication as of today

## 2020-12-23 ENCOUNTER — Other Ambulatory Visit: Payer: Self-pay

## 2020-12-23 ENCOUNTER — Telehealth: Payer: Self-pay | Admitting: Internal Medicine

## 2020-12-23 DIAGNOSIS — E0590 Thyrotoxicosis, unspecified without thyrotoxic crisis or storm: Secondary | ICD-10-CM

## 2020-12-23 MED ORDER — PROPRANOLOL HCL 10 MG PO TABS: 10.0000 mg | ORAL_TABLET | Freq: Two times a day (BID) | ORAL | 3 refills | Status: DC

## 2020-12-23 NOTE — Telephone Encounter (Signed)
Sent refills to pharmacy

## 2020-12-23 NOTE — Telephone Encounter (Signed)
Received a call the after hours service requesting refill on Propranolol 10 mg  on 12/22/2020 , the pt has been calling for the past few days    [facility] to [facility]   Propranolol 10 mg BID # 180   Abby Raelyn Mora, MD  [facility]  [facility] [address] [place], [place] [address] Phone: [phone] FAX: [phone]

## 2021-01-05 ENCOUNTER — Ambulatory Visit: Payer: BC Managed Care – PPO | Attending: Family Medicine | Admitting: Speech Pathology

## 2021-01-05 ENCOUNTER — Other Ambulatory Visit: Payer: Self-pay

## 2021-01-05 DIAGNOSIS — R2689 Other abnormalities of gait and mobility: Secondary | ICD-10-CM | POA: Insufficient documentation

## 2021-01-05 DIAGNOSIS — R4789 Other speech disturbances: Secondary | ICD-10-CM | POA: Insufficient documentation

## 2021-01-05 DIAGNOSIS — R2681 Unsteadiness on feet: Secondary | ICD-10-CM | POA: Diagnosis present

## 2021-01-05 NOTE — Patient Instructions (Addendum)
   Tongue behind your lower teeth for sssssssss  Blow air over your tongue  Tongue tip behind upper teeth on the ridge and pop tongue off "T" "T" "T" "T" with air puff  [email or web address] - Android app for voice recognition for individuals with speech impairment   These sounds are the exact same, except we turn our voice on and off:  S, Z   T,D  CH, SHZ

## 2021-01-05 NOTE — Therapy (Addendum)
[facility] [address] [place], [place], [address] Phone: [phone]   Fax:  [phone]  Speech Language Pathology Treatment  Patient Details  Name: Janet Dennis MRN: [id number] Date of Birth: 04-06-1981 Referring Provider (SLP): Dr. Minette Dennis   Encounter Date: 01/05/2021   End of Session - 01/05/21 1630     Visit Number 1    Number of Visits 9    Date for SLP Re-Evaluation 02/16/21    SLP Start Time 1015    SLP Stop Time  1055    SLP Time Calculation (min) 40 min    Activity Tolerance Patient tolerated treatment well             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   Subjective Assessment - 01/05/21 1618     Subjective "I want to get a voice liscence from [employer]"    Patient is accompained by: Family member   Dad   Currently in Pain? No/denies               SLP Evaluation [facility] - 01/05/21 1027       SLP Visit Information   SLP Received On 01/05/21    Referring Provider (SLP) Dr. Minette Dennis    Onset Date developmental    Medical Diagnosis Speech disorder; seizures      Subjective   Patient/Family Stated Goal "To talk clearly"      General Information   HPI Pt reports being off balance since having her last seizure. She has had several episodes of almost falling, but has been able to catch herself. Has recently seen [facility] for seizure disorder.    Pt would like to discuss having speech therapy. She is interested in using google voice to help with things but believes she needs to improve her speech first. Pt is also in a group of people that uses their  voices to help others and thinks she would greatly benefit from this as well. Never had speech therapy before.     She has h/o seizure and hyperthyroidism, neurology, endocrinology following, respectively.   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    Mobility Status walks independently      Balance Screen   Has the patient fallen in the past 6 months No    Has the patient had a decrease in activity level because of a fear of falling?  No    Is the patient reluctant to leave their home because of a fear of falling?  No      Prior Functional Status   Cognitive/Linguistic Baseline Within functional limits    Type of Home House     Lives With Family    Vocation On disability      Cognition   Overall Cognitive Status Within Functional Limits for tasks assessed      Verbal Expression   Overall Verbal Expression Appears within functional limits for tasks assessed      Oral Motor/Sensory Function   Overall Oral Motor/Sensory Function Appears within  functional limits for tasks assessed    Mandible Other (Comment)    Overall Oral Motor/Sensory Function Class 3 malocclusion      Motor Speech   Overall Motor Speech Impaired    Respiration Within functional limits    Phonation Normal    Resonance Within functional limits    Articulation Impaired    Level of Impairment Conversation    Intelligibility Intelligible    Motor Planning Witnin functional limits    Motor Speech Errors Aware    Interfering Components Anatomical limitations   blind - malocclusion   Effective Techniques Slow rate;Increased vocal intensity;Over-articulate;Pause                  SLP Education - 01/05/21 1629     Education Details HEP    Person(s) Educated Patient;Parent(s)    Methods Explanation;Demonstration;Verbal cues;Handout   Emailed to her for text to speech, they have braille machine at home   Comprehension Verbalized understanding;Verbal cues required;Tactile cues required;Need further  instruction                SLP Long Term Goals - 01/05/21 1639       SLP LONG TERM GOAL #1   Title Pt will demonstrate correct placement and production  /s/ and /z/ in isolation 20/20x each with rare min A    Time 6    Period Weeks    Status New      SLP LONG TERM GOAL #2   Title Pt will produce /s/ and /z/ at word level in initial and final positions 15/20x each with rare min A    Time 6    Period Weeks    Status New      SLP LONG TERM GOAL #3   Title Pt will carryover 3 strategies (SLOP) to maximize intelligiblity for voice commands to computer on 10 personall relevant sentences she wants to use.    Time 6    Period Weeks    Status New      SLP LONG TERM GOAL #4   Title Pt will produce /s/, /z/ accurately in all positions at sentence level (excluding blends) using strategies 15/15 sentenes    Time 6    Period Weeks    Status New              Plan - 01/05/21 1631     Clinical Impression Statement Janet Dennis is referred to outpt ST by PCP for speech impairment. 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 She has never received speech therapy. She is 100% intelligible, but would like to improve her articulation. Spontaneous converstion revelaed posterior lingual placement disorting /s/, /s/ blends, zh, ch, /z/. In isolation, she did produce /z/ 1x with correct anterior dental placement, therefore may be stimulable for correct placement/production of /s/. Her father endorses he notes mispronounciation of /s/ as well. Janet Dennis denies difficulty being understood in person or over the phone. Her father reports he "sometimes" has difficulty understanding her if her head is down or she is turned away. Oval's goal is to be successful using google voice. I recommend short course of trial speech therapy for ongoing assessment of structural ability to produce errors sounds accurately with  improved articualtory placement.  Her malocclusion may affect progress. She will need intra-oral tactile cues for placement due to blindness and is agreeable to this.    Speech Therapy Frequency 2x / week  Duration --   6 weeks or 9 visits   Treatment/Interventions Environmental controls;Cueing hierarchy;Functional tasks;Compensatory strategies;Compensatory techniques;Multimodal communcation approach;Internal/external aids;Patient/family education;SLP instruction and feedback    Potential to Achieve Goals Fair    Potential Considerations Co-morbidities;Previous level of function             Patient will benefit from skilled therapeutic intervention in order to improve the following deficits and impairments:   Other speech disturbance    Problem List Patient Active Problem List   Diagnosis Date Noted   Seizure (HCC) 08/18/2020   Hyponatremia 08/18/2020   Hyperthyroidism 08/18/2020    Thierry Dobosz, Radene Journey MS, CCC-SLP 01/05/2021, 4:45 PM  [facility] [address] [place], [place], [address] Phone: [phone]   Fax:  [phone]   Name: Janet Dennis MRN: [id number] Date of Birth: [birth year]

## 2021-01-10 ENCOUNTER — Other Ambulatory Visit: Payer: Self-pay

## 2021-01-10 ENCOUNTER — Ambulatory Visit: Payer: BC Managed Care – PPO

## 2021-01-10 DIAGNOSIS — R4789 Other speech disturbances: Secondary | ICD-10-CM

## 2021-01-10 NOTE — Therapy (Signed)
[facility] [address] [place], [place], [address] Phone: [phone]   Fax:  [phone]  Speech Language Pathology Treatment  Patient Details  Name: Janet Dennis MRN: [id number] Date of Birth: 1980-12-20 Referring Provider (SLP): Dr. 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   Subjective Assessment - 01/10/21 1844     Subjective "I need to work on my speech"    Patient is accompained by: Family member   dad   Currently in Pain? 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 Usual fading to occasional verbal and tactile cues required to optimize tongue placemnet on alveolar ridge. SLP used tongue depressor to target tongue tip versus all of tongue. SLP trialed single words in isolation for initial, medial, and final positions. Pt was most successful for /s/ versus /z/ this session for words in isolation. Usual posterior  placement of tongue noted in conversation with buccal tension and tight bite noted. SLP targeted some sounds with open mouth, round lip productions to reduce tension, with usual tactile cues required.      Assessment / Recommendations / Plan   Plan Continue with current plan of care      Progression Toward Goals   Progression toward goals Progressing toward goals              SLP Education - 01/10/21 1851     Education Details tongue placement, HEP    Person(s) Educated Patient;Parent(s)    Methods Explanation;Demonstration;Handout    Comprehension Verbalized understanding;Returned demonstration;Need further instruction                SLP Long Term Goals - 01/10/21 1649       SLP LONG TERM GOAL #1   Title Pt will demonstrate correct placement and production  /s/ and /z/ in isolation 20/20x each with rare min A    Time 6    Period Weeks    Status On-going      SLP LONG TERM GOAL #2   Title Pt will produce /s/ and /z/ at word level in initial and final positions 15/20x each with rare  min A    Time 6    Period Weeks    Status On-going      SLP LONG TERM GOAL #3   Title Pt will carryover 3 strategies (SLOP) to maximize intelligiblity for voice commands to computer on 10 personall relevant sentences she wants to use.    Time 6    Period Weeks    Status On-going      SLP LONG TERM GOAL #4   Title Pt will produce /s/, /z/ accurately in all positions at sentence level (excluding blends) using strategies 15/15 sentenes    Time 6    Period Weeks    Status On-going              Plan - 01/10/21 1649     Clinical Impression Statement Janet Dennis was referred to outpt ST by PCP for speech impairment. 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 She has never received speech therapy. She is 100% intelligible, but would like to improve her articulation. Spontaneous converstion revelaed posterior lingual placement  disorting /s/, /s/ blends, zh, ch, /z/. Pt able to produce intermittently produce /s/ and rarely /z/ this session in isolation given usual mod to max verbal and tactile cues to aid tongue placement and shape.  I recommend short course of trial speech therapy for ongoing assessment of structural ability to produce errors sounds accurately with improved articualtory placement.  Her malocclusion may affect progress. She will need intra-oral tactile cues for placement due to blindness and is agreeable to this.    Speech Therapy Frequency 2x / week    Duration Other (comment)   6 weeks or 9 visits   Treatment/Interventions Environmental controls;Cueing hierarchy;Functional tasks;Compensatory strategies;Compensatory techniques;Multimodal communcation approach;Internal/external aids;Patient/family education;SLP instruction and feedback    Potential to Achieve Goals Fair    Potential Considerations Co-morbidities;Previous level of function    Consulted and Agree with Plan of Care Patient             Patient will benefit from skilled therapeutic intervention in order to improve the following deficits and impairments:   Other speech disturbance    Problem List Patient Active Problem List   Diagnosis Date Noted   Seizure (HCC) 08/18/2020   Hyponatremia 08/18/2020   Hyperthyroidism 08/18/2020    Janann Colonel, MA CCC-SLP 01/10/2021, 6:53 PM  [facility] [address] [place], [place], [address] Phone: [phone]   Fax:  [phone]   Name: Janet Dennis MRN: [id number] Date of Birth: 1980/06/10

## 2021-01-12 ENCOUNTER — Ambulatory Visit: Payer: BC Managed Care – PPO | Admitting: Speech Pathology

## 2021-01-12 ENCOUNTER — Encounter: Payer: Self-pay | Admitting: Speech Pathology

## 2021-01-12 ENCOUNTER — Other Ambulatory Visit: Payer: Self-pay

## 2021-01-12 DIAGNOSIS — R4789 Other speech disturbances: Secondary | ICD-10-CM

## 2021-01-12 NOTE — Patient Instructions (Signed)
   When you are making s and z your sides of your tongue are on your upper molars for stabilization  Your tongue tip is behind your front teeth  Practice 15 s  Then start with s and then turn your voice on for z but don't move your tongue  With z you should feel a slight tickle on the tip of your tongue   Be aware of if you move your tongue back  Th is made with your tongue tip sticking out  of your front teeth slightly  Practice Th 15 times   Then keep your tongue out, make voiceless Th then turn on your voice for voiced Th   Practice your words that Natalia Leatherwood gave you making sure you tongue is behind your front teeth  This That  Than These Those Thin Think Thick Thought  With

## 2021-01-12 NOTE — Therapy (Signed)
[facility] [address] [place], [place], [address] Phone: [phone]   Fax:  [phone]  Speech Language Pathology Treatment  Patient Details  Name: Janet Dennis MRN: [id number] Date of Birth: 06-25-1980 Referring Provider (SLP): Dr. 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   Subjective Assessment - 01/12/21 1552     Subjective "I haven't had a chance to look at the website you gave me" re: [employer] app    Currently in Pain? 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 Usual fading to occasional verbal and tactile cues required to optimize tongue placemnet on alveolar ridge. SLP used tongue depressor to target tongue tip versus all of tongue. SLP trialed single words in isolation for initial, medial, and final positions. Pt was most successful for /s/ versus /z/ this session for words in isolation. When cv  or cvc word added, pt  required frequent mod A to maintain anterior placement of tongue. Used tactile cues for stabilization of lateral mid tongue on uper molars with placement of tip behind front teeth. Janet Dennis protruded tongue, therefore we targeted /s/ vs "th" in isolation with 2/20 attempts successful. Attempted /z/ in isolation adding phonation to prolonged /s/ with consistent max A. HW provided for /s,z/ and th and voiced th to help habitualize frontal articulatory placement      Assessment / Recommendations / Plan   Plan Continue with current plan of care      Progression Toward Goals   Progression toward goals Progressing toward goals              SLP Education - 01/12/21 1601     Education Details HEP, tongue placement    Person(s) Educated Patient    Methods Explanation;Demonstration;Handout   emailed handout   Comprehension Verbalized understanding;Returned demonstration;Need further instruction;Verbal cues required                SLP Long Term Goals - 01/12/21 1604       SLP LONG TERM GOAL #1   Title Pt will demonstrate correct placement and production  /s/ and /z/ in isolation 20/20x each with rare min A  Time 6    Period Weeks    Status On-going      SLP LONG TERM GOAL #2   Title Pt will produce /s/ and /z/ at word level in initial and final positions 15/20x each with rare min A    Time 6    Period Weeks    Status On-going      SLP LONG TERM GOAL #3   Title Pt will carryover 3 strategies (SLOP) to maximize intelligiblity for voice commands to computer on 10 personall relevant sentences she wants to use.    Time 6    Period Weeks    Status On-going      SLP LONG TERM GOAL #4   Title Pt will produce /s/, /z/ accurately in all positions at sentence level (excluding blends) using strategies 15/15 sentenes    Time 6    Period Weeks    Status On-going              Plan - 01/12/21 1601     Clinical Impression Statement Initiated training in anterior placement of  tongue for s,z,th and voiced th. She requires consistient modeling (sound) and verbal cues to maintain anterior lingual placement for these phonemes in isolation and cv cvc words.Underbite, visual and hearing impairment affect her habiltualized back placement for thes sounds. 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  [employer] Therapy Frequency 2x / week    Duration --   6 weeks or 9 visits   Treatment/Interventions Environmental controls;Cueing hierarchy;Functional tasks;Compensatory strategies;Compensatory techniques;Multimodal communcation approach;Internal/external aids;Patient/family education;SLP instruction and feedback    Potential to Achieve Goals Fair    Potential Considerations Co-morbidities;Previous level of function             Patient will benefit from skilled therapeutic intervention in order to improve the following deficits and impairments:   Other speech disturbance    Problem List Patient Active Problem List   Diagnosis Date Noted   Seizure (HCC) 08/18/2020   Hyponatremia 08/18/2020   Hyperthyroidism 08/18/2020    Janet Dennis, Radene Journey MS, CCC-SLP 01/12/2021, 4:05 PM  [facility] [address] [place], [place], [address] Phone: [phone]   Fax:  [phone]   Name: Janet Dennis MRN: [id number] Date of Birth: 09/30/1980

## 2021-01-17 ENCOUNTER — Other Ambulatory Visit: Payer: Self-pay

## 2021-01-17 ENCOUNTER — Ambulatory Visit (INDEPENDENT_AMBULATORY_CARE_PROVIDER_SITE_OTHER): Payer: BC Managed Care – PPO | Admitting: Endocrinology

## 2021-01-17 DIAGNOSIS — E0590 Thyrotoxicosis, unspecified without thyrotoxic crisis or storm: Secondary | ICD-10-CM | POA: Diagnosis not present

## 2021-01-17 LAB — TSH: TSH: 24.88 u[IU]/mL — ABNORMAL HIGH (ref 0.35–5.50)

## 2021-01-17 LAB — T4, FREE: Free T4: 0.77 ng/dL (ref 0.60–1.60)

## 2021-01-17 MED ORDER — PROPRANOLOL HCL 10 MG PO TABS: 5.0000 mg | ORAL_TABLET | Freq: Two times a day (BID) | ORAL | 3 refills | Status: DC

## 2021-01-17 NOTE — Progress Notes (Signed)
   Subjective:    Patient ID: Janet Dennis, female    DOB: [birth year], 40 y.o.   MRN: [id number]  HPI Pt returns for f/u of hyperthyroidism (dx'ed early 2022, when she pres with sz; she was rx'ed tapazole and b-blocker; she has never had thyroid imaging).  Since on tapazole, pt states she feels well in general.  Specifically, she denies palpitations and tremor.   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   Premature baby    Seizures (HCC) 08/2020    Past Surgical History:  Procedure Laterality Date   PATENT DUCTUS ARTERIOUS REPAIR     as infant   SCAR REVISION     WISDOM TOOTH EXTRACTION      Social History   Socioeconomic History   Marital status: Single    Spouse name: Not on file   Number of children: 0   Years of education: Not on file   Highest education level: Associate degree: academic program  Occupational History   Not on file  Tobacco Use   Smoking status: Never   Smokeless tobacco: Never  Vaping Use   Vaping Use: Never used  Substance and Sexual Activity   Alcohol use: Yes    Comment: occ   Drug use: Never   Sexual activity: Never    Birth control/protection: None  Other Topics Concern   Not on file  Social History Narrative   Lives with family   Social Determinants of Health   Financial Resource Strain: Not on file  Food Insecurity: Not on file  Transportation Needs: Not on file  Physical Activity: Not on file  Stress: Not on file  Social Connections: Not on file  Intimate Partner Violence: Not on file    Current Outpatient Medications on File Prior to Visit  Medication Sig Dispense Refill   levETIRAcetam (KEPPRA) 500 MG tablet Take 1 tablet (500 mg total) by mouth 2 (two) times daily. 180 tablet 4   methimazole (TAPAZOLE) 10 MG tablet Take 1 tablet (10 mg total) by mouth daily. 90 tablet 0   neomycin-polymyxin b-dexamethasone (MAXITROL) 3.5-10000-0.1 OINT Place  1 application into both eyes in the morning and at bedtime.     neomycin-polymyxin b-dexamethasone (MAXITROL) 3.5-10000-0.1 SUSP Place 1 drop into both eyes in the morning and at bedtime. (Patient not taking: Reported on 01/05/2021)     No current facility-administered medications on file prior to visit.    No Known Allergies  Family History  Problem Relation Age of Onset   Thyroid disease Father     BP 110/70 (BP Location: Right Arm, Patient Position: Sitting, Cuff Size: Normal)   Pulse 62   Ht 5\' 1"  (1.549 m)   Wt 135 lb 3.2 oz (61.3 kg)   SpO2 98%   BMI 25.55 kg/m   Review of Systems Denies fever    Objective:   Physical Exam VITAL SIGNS:  See vs page GENERAL: no distress NECK: thyroid is slightly and diffusely enlarged.   Lab Results  Component Value Date   TSH 24.88 (H) 01/17/2021       Assessment & Plan:  Hyperthyroidism: overcontrolled.   D/c tapazole and inderal.  Please come back for a follow-up appointment in 6 weeks.

## 2021-01-17 NOTE — Patient Instructions (Addendum)
Blood tests are requested for you today.  We'll let you know about the results.  Please reduce the propranolol to 1/2 of a 10 mg pill, twice a day.   If ever you have fever while taking methimazole, stop it and call us, even if the reason is obvious, because of the risk of a rare side-effect. It is best to never miss the methimazole.  However, if you do miss it, next best is to double up the next time.   Please come back for a follow-up appointment in 6 weeks.

## 2021-01-19 ENCOUNTER — Other Ambulatory Visit: Payer: Self-pay

## 2021-01-19 ENCOUNTER — Encounter: Payer: Self-pay | Admitting: Speech Pathology

## 2021-01-19 ENCOUNTER — Ambulatory Visit: Payer: BC Managed Care – PPO | Admitting: Speech Pathology

## 2021-01-19 DIAGNOSIS — R4789 Other speech disturbances: Secondary | ICD-10-CM

## 2021-01-19 NOTE — Therapy (Signed)
[facility] [address] [place], [place], [address] Phone: [phone]   Fax:  [phone]  Speech Language Pathology Treatment  Patient Details  Name: Janet Dennis MRN: [id number] Date of Birth: 1981-05-01 Referring Provider (SLP): Dr. 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   Subjective Assessment - 01/19/21 1410     Subjective "I practiced a little today"    Currently in Pain? 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 Janet Dennis required frequent mod verbal and sound cues for placement of s and z at phrase and sentence level 13/15 phrases as well as cues to slow rate. Th and voiced th ariculated correctly with usual mod verbal cues 7/7 phrases. Janet Dennis required ongoing cues to keep tongue forward behind her teeth vs in front of her teeth for /s/ she did endorse hearing the difference between s and th at word  level. We generated 9 personally relevant sentences with target sounds re: her podcast and hobbies.      Assessment / Recommendations / Plan   Plan Continue with current plan of care      Progression Toward Goals   Progression toward goals Progressing toward goals              SLP Education - 01/19/21 1444     Education Details pay attention to forward placement at home practice    Person(s) Educated Patient    Methods Explanation;Demonstration;Handout   HW emailed to her   Comprehension Verbalized understanding;Returned demonstration;Verbal cues required;Need further instruction                SLP Long Term Goals - 01/19/21 1448       SLP LONG TERM GOAL #1   Title Pt will demonstrate correct placement and production  /s/ and /z/ in isolation 20/20x each with rare min A    Time 5    Period Weeks    Status On-going      SLP LONG TERM GOAL #2   Title Pt will produce /s/ and /z/ at word level in initial and final positions 15/20x each with rare min A  Time 5    Period Weeks    Status On-going      SLP LONG TERM GOAL #3   Title Pt will carryover 3 strategies (SLOP) to maximize intelligiblity for voice commands to computer on 10 personall relevant sentences she wants to use.    Time 5    Period Weeks    Status On-going      SLP LONG TERM GOAL #4   Title Pt will produce /s/, /z/ accurately in all positions at sentence level (excluding blends) using strategies 15/15 sentenes    Time 5    Period Weeks    Status On-going              Plan - 01/19/21 1445     Clinical Impression Statement Progressed to phrases with target sounds in all positions and with consonant cluster with frequent mod A for placement. Generated personally relevant phrases re: her podcast with target sounds, again with frequent mod A for correct placement. Continue skilled ST to improve articulation of s, z, and voiced and voiceless th including in clusters for success in podcast and public  speaking    Speech Therapy Frequency 2x / week    Duration --   6 weeks or 9 visits   Treatment/Interventions Environmental controls;Cueing hierarchy;Functional tasks;Compensatory strategies;Compensatory techniques;Multimodal communcation approach;Internal/external aids;Patient/family education;SLP instruction and feedback    Potential to Achieve Goals Fair    Potential Considerations Co-morbidities;Previous level of function             Patient will benefit from skilled therapeutic intervention in order to improve the following deficits and impairments:   Other speech disturbance    Problem List Patient Active Problem List   Diagnosis Date Noted   Seizure (HCC) 08/18/2020   Hyponatremia 08/18/2020   Hyperthyroidism 08/18/2020    Janet Dennis, Radene Journey MS, CCC-SLP 01/19/2021, 2:50 PM  [facility] [address] [place], [place], [address] Phone: [phone]   Fax:  [phone]   Name: Janet Dennis MRN: [id number] Date of Birth: 09/12/1980

## 2021-01-19 NOTE — Patient Instructions (Addendum)
  Scissors Stop saving scissors Sell his stuff See the bees Skate with Darl Pikes She sells stuff on the [employer] Episodes seem nice Mastermind the symphony [employer] It's on sale this Saturday Amazingly they are accessible  Please sponsor my podcast I usually get stuff from [employer] super silly List the words that are troublesome Go slow and make sure you tongue is forward for each s, z, th and voiced th

## 2021-01-21 ENCOUNTER — Other Ambulatory Visit: Payer: Self-pay

## 2021-01-21 ENCOUNTER — Ambulatory Visit: Payer: BC Managed Care – PPO

## 2021-01-21 DIAGNOSIS — R4789 Other speech disturbances: Secondary | ICD-10-CM

## 2021-01-21 NOTE — Therapy (Signed)
[facility] [address] [place], [place], [address] Phone: [phone]   Fax:  [phone]  Speech Language Pathology Treatment  Patient Details  Name: Janet Dennis MRN: [id number] Date of Birth: Oct 08, 1980 Referring Provider (SLP): Dr. 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   Subjective Assessment - 01/21/21 1623     Subjective "I think I sound a little better"    Currently in Pain? 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 Aanika required frequent mod verbal, tactile, and sound cues for placement of s and z at word, phrase, and sentence level as well as cues to slow rate. Improved carryover of /th/ exhibited in phrase and sentence level compared to /s,z/.  Bro[place]ley required ongoing cues to keep tongue forward behind her teeth vs in front of her teeth for /s/. Intermittent awareness of errors demonstrated, although  inconsistent.      Assessment / Recommendations / Plan   Plan Continue with current plan of care      Progression Toward Goals   Progression toward goals Progressing toward goals              SLP Education - 01/21/21 1626     Education Details forward placement of tongue    Person(s) Educated Patient    Methods Explanation;Demonstration;Handout;Verbal cues   emailed   Comprehension Verbalized understanding;Returned demonstration;Verbal cues required;Need further instruction                SLP Long Term Goals - 01/21/21 1535       SLP LONG TERM GOAL #1   Title Pt will demonstrate correct placement and production  /s/ and /z/ in isolation 20/20x each with rare min A    Time 5    Period Weeks    Status On-going      SLP LONG TERM GOAL #2   Title Pt will produce /s/ and /z/ at word level in initial and final positions 15/20x each with rare min A    Time 5    Period Weeks    Status On-going      SLP LONG TERM GOAL #3  Title Pt will carryover 3 strategies (SLOP) to maximize intelligiblity for voice commands to computer on 10 personall relevant sentences she wants to use.    Time 5    Period Weeks    Status On-going      SLP LONG TERM GOAL #4   Title Pt will produce /s/, /z/ accurately in all positions at sentence level (excluding blends) using strategies 15/15 sentenes    Time 5    Period Weeks    Status On-going              Plan - 01/21/21 1627     Clinical Impression Statement Scaffolded articulation tasks of targeted work back to word level then to phrases as inconsistent placement for target sounds exhibited at word level intially. Frequent mod A for placement throughout all tasks, although some increased awareness of backward tongue placement noted. Continue skilled ST to improve articulation of s, z, and voiced and voiceless th including in clusters for success in podcast and public speaking    Speech Therapy Frequency 2x / week    Duration --   6  weeks or 9 visits   Treatment/Interventions Environmental controls;Cueing hierarchy;Functional tasks;Compensatory strategies;Compensatory techniques;Multimodal communcation approach;Internal/external aids;Patient/family education;SLP instruction and feedback    Potential to Achieve Goals Fair    Potential Considerations Co-morbidities;Previous level of function    SLP Home Exercise Plan provided    Consulted and Agree with Plan of Care Patient             Patient will benefit from skilled therapeutic intervention in order to improve the following deficits and impairments:   Other speech disturbance    Problem List Patient Active Problem List   Diagnosis Date Noted   Seizure (HCC) 08/18/2020   Hyponatremia 08/18/2020   Hyperthyroidism 08/18/2020    Janann Colonel, MA CCC-SLP 01/21/2021, 4:30 PM  [facility] [address] [place], [place], [address] Phone: [phone]   Fax:  [phone]   Name: Belle Charlie MRN: [id number] Date of Birth: May 29, 1981

## 2021-01-24 ENCOUNTER — Encounter: Payer: Self-pay | Admitting: Speech Pathology

## 2021-01-24 ENCOUNTER — Other Ambulatory Visit: Payer: Self-pay

## 2021-01-24 ENCOUNTER — Ambulatory Visit: Payer: BC Managed Care – PPO

## 2021-01-24 ENCOUNTER — Ambulatory Visit: Payer: BC Managed Care – PPO | Admitting: Speech Pathology

## 2021-01-24 DIAGNOSIS — R2681 Unsteadiness on feet: Secondary | ICD-10-CM

## 2021-01-24 DIAGNOSIS — R4789 Other speech disturbances: Secondary | ICD-10-CM

## 2021-01-24 DIAGNOSIS — R2689 Other abnormalities of gait and mobility: Secondary | ICD-10-CM

## 2021-01-24 NOTE — Therapy (Signed)
[facility] [address] [place], [place], [address] Phone: [phone]   Fax:  [phone]  Physical Therapy Evaluation  Patient Details  Name: Janet Dennis MRN: [id number] Date of Birth: 08/26/1980 No data recorded  Encounter Date: 01/24/2021   PT End of Session - 01/24/21 1451     Visit Number 1    Number of Visits 9    Date for PT Re-Evaluation 03/28/21    Authorization Type [employer]    Authorization Time Period 01/24/21-03/28/21    Progress Note Due on Visit 9    PT Start Time 1400    PT Stop Time 1445    PT Time Calculation (min) 45 min    Activity Tolerance Patient tolerated treatment well    Behavior During Therapy [facility] for tasks assessed/performed             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  She has a fear of falling and this has led her to refraininfrom her ususal pastimes and support groups.    Pertinent History Pt is a 40 year old female referred to OPPT for evaluation and treatment with Referring Diagnosis: Abnormality of gait due to impairment of balance. Pt presents with physical impairments of impaired activity tolerance, impaired standing balance, impaired motor coordination, and decreased safety awareness impacting safe and independent functional mobility. Examination revealed pt is at risk for falls and functional decline as evidenced by the following objective test measures: of 337ft (norm for patient  age and gender 533ft), TUG of 16sec (>12sec indicates increased risk for falls), and 5x sit to stand of 22sec (>15sec indicates increased risk for falls and decreased BLE power). Patient's father reports he is her transportation and he will be going back to work soon (works in [employer] and summer break ends early next week). She takes public transportation in [place] city but they will not bring her here. They are waiting on referral from PCP to clinic in [place] so will be placed on hold at this time.    Limitations Walking    How long can you sit comfortably? unlimited    How long can you stand comfortably? <15 min    How long can you walk comfortably? <15 min    Patient Stated Goals to regain her balance    Currently in Pain? No/denies                [facility] PT Assessment - 01/24/21 0001       Assessment   Medical Diagnosis gait disorder      Precautions   Precautions Fall    Precaution Comments due to seizure      Balance Screen   Has the patient fallen in the past 6 months No    Has the patient had a decrease in activity level because of  a fear of falling?  Yes    Is the patient reluctant to leave their home because of a fear of falling?  Yes      Home Environment   Living Environment Private residence    Living Arrangements Parent    Type of Home House    Home Access Stairs to enter    Entrance Stairs-Number of Steps 6    Entrance Stairs-Rails Can reach both    Home Layout Two level      Prior Function   Level of Independence Independent      Transfers   Transfers Sit to Stand;Stand to Sit    Five time sit to stand comments  16.9s    Comments no arms required      Ambulation/Gait   Stairs Yes    Stairs Assistance 4: Min guard    Stair Management Technique Two rails;Alternating pattern;Step to pattern    Number of Stairs 4    Height of Stairs 6      Berg Balance Test   Sit to Stand Able to stand without using hands and stabilize independently     Standing Unsupported Able to stand safely 2 minutes    Sitting with Back Unsupported but Feet Supported on Floor or Stool Able to sit safely and securely 2 minutes    Stand to Sit Sits safely with minimal use of hands    Transfers Able to transfer safely, minor use of hands    Standing Unsupported with Eyes Closed Able to stand 10 seconds safely    Standing Unsupported with Feet Together Able to place feet together independently and stand 1 minute safely    From Standing, Reach Forward with Outstretched Arm Can reach confidently >25 cm (10")    From Standing Position, Pick up Object from Floor Able to pick up shoe safely and easily    From Standing Position, Turn to Look Behind Over each Shoulder Looks behind one side only/other side shows less weight shift    Turn 360 Degrees Able to turn 360 degrees safely but slowly    Standing Unsupported, Alternately Place Feet on Step/Stool Needs assistance to keep from falling or unable to try    Standing Unsupported, One Foot in Front Able to plae foot ahead of the other independently and hold 30 seconds    Standing on One Leg Tries to lift leg/unable to hold 3 seconds but remains standing independently    Total Score 45    Berg comment: patient is vision impaired      High Level Balance   High Level Balance Comments mCTSIB able to hold all 4 positions                        Objective measurements completed on examination: See above findings.               PT Education - 01/24/21 1451     Education Details Written HEP emailed to patient    Person(s) Educated Patient    Methods Explanation;Demonstration    Comprehension Verbalized understanding;Returned demonstration              PT Short Term Goals - 01/24/21 1501       PT SHORT TERM GOAL #1   Title Patient to demo I in initial HEP    Baseline [email or web address]    Time 4    Period Weeks    Status New    Target Date 02/28/21  PT SHORT TERM GOAL #2   Title  Assess FGA and set appropriate goal    Baseline TBD    Time 4    Period Weeks    Status New    Target Date 02/28/21      PT SHORT TERM GOAL #3   Title Improve 5x STS test score to <13s    Baseline 16.9s    Time 4    Period Weeks    Status New    Target Date 02/28/21               PT Long Term Goals - 01/24/21 1503       PT LONG TERM GOAL #1   Title Patient to negotiate 16 steps with single rail and step through pattern    Baseline 4 steps with step to pattern and B rails    Time 8    Period Weeks    Status New    Target Date 03/28/21      PT LONG TERM GOAL #2   Title Assess progress towards FGA goal    Baseline TBD    Time 8    Period Weeks    Status New    Target Date 03/28/21      PT LONG TERM GOAL #3   Title Patient to ambulate across outdoor surfaces with S    Baseline In clinic ambulation arm-in-arm with therapist    Time 8    Period Weeks    Status New    Target Date 03/28/21                    Plan - 01/24/21 1454     Clinical Impression Statement Patient referred to OPPT due to balance disorder following seizure.  She is legally blind and does not work.  She lives with parents who provide transportation but she is now taking public transportation due to their work schedules.  She demos BLE weakness as evidenced by 5x STS score as well as static blance defcits as evidence by BERG score.  PAtient is a good candidate for OPPT.  Eval findings, rehab potential and POC explained to patient and she is in agreement    Personal Factors and Comorbidities Comorbidity 1    Comorbidities legally blind    Examination-Activity Limitations Locomotion Level    Stability/Clinical Decision Making Stable/Uncomplicated    Clinical Decision Making Low    Rehab Potential Good    PT Frequency 1x / week    PT Duration 8 weeks    PT Treatment/Interventions ADLs/Self Care Home Management;Aquatic Therapy;DME Instruction;Gait training;Stair training;Functional  mobility training;Therapeutic activities;Therapeutic exercise;Balance training;Neuromuscular re-education;Patient/family education    PT Next Visit Plan Expand HEP, assessDGI/FGA    PT Home Exercise Plan [email or web address]    Consulted and Agree with Plan of Care Patient             Patient will benefit from skilled therapeutic intervention in order to improve the following deficits and impairments:  Abnormal gait, Difficulty walking, Decreased endurance, Decreased safety awareness, Decreased activity tolerance, Decreased balance, Decreased strength  Visit Diagnosis: Unsteadiness on feet  Balance disorder  Other abnormalities of gait and mobility     Problem List Patient Active Problem List   Diagnosis Date Noted   Seizure (HCC) 08/18/2020   Hyponatremia 08/18/2020   Hyperthyroidism 08/18/2020    Hildred Laser PT 01/24/2021, 3:10 PM  [facility] [address] [place], [place], [address] Phone:  [phone]   Fax:  [phone]  Name: Skylin Kennerson MRN: [id number] Date of Birth: November 11, 1980

## 2021-01-24 NOTE — Patient Instructions (Signed)
Access Code: [email or web address] URL: [email or web address] Date: 01/24/2021 Prepared by: Gustavus Bryant  Exercises Sit to Stand - 2 x daily - 7 x weekly - 1 sets - 5 reps

## 2021-01-24 NOTE — Therapy (Signed)
[facility] [address] [place], [place], [address] Phone: [phone]   Fax:  [phone]  Speech Language Pathology Treatment  Patient Details  Name: Janet Dennis MRN: [id number] Date of Birth: 01/05/1981 Referring Provider (SLP): Dr. 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   Subjective Assessment - 01/24/21 1317     Subjective "I want to do a podcast this week"    Currently in Pain? 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 Reviewed personally relevant phrases from prior sessions, again 4/8 with consistent verbal cues, auditory modeling for placement and air flow. Generated 12 more personally relevant sentences/phrases re: her podcasts, she required consistent cueing for slow rate to focus on placement of target sounds. Unintelligilbe  utterance (a book title) noted requiring slow rate, over artiuclation and pausing in between words to improve intelligibility. Educated Yoselin that novel, unpredicatable utterances such as titles will require slow rate and pausing to improve listener comprehension on podcast and in person. Targeted these compensations in sentences 6/8 accurately with ongoing cueing. Instructed Madysin to lood ahead to her next podcasts ;and ID and practice words with target sound slowly. Nadie asked about correcting her underbite. Instructed her to talk to her orthodontist.      Assessment / Recommendations / Plan   Plan Continue with current plan of care      Progression Toward Goals   Progression toward goals Progressing toward goals              SLP Education - 01/24/21 1602     Education Details compensations for intelligiblity, placement of target sounds, practice slowly    Person(s) Educated Patient    Methods Explanation;Demonstration;Verbal cues;Handout   emailed   Comprehension Verbalized understanding;Returned demonstration;Verbal cues required;Need further instruction                SLP Long Term Goals - 01/24/21 1603       SLP LONG TERM GOAL #1   Title Pt will demonstrate correct placement and production  /s/ and /z/ in isolation 20/20x  each with rare min A    Time 4    Period Weeks    Status On-going      SLP LONG TERM GOAL #2   Title Pt will produce /s/ and /z/ at word level in initial and final positions 15/20x each with rare min A    Time 4    Period Weeks    Status On-going      SLP LONG TERM GOAL #3   Title Pt will carryover 3 strategies (SLOP) to maximize intelligiblity for voice commands to computer on 10 personall relevant sentences she wants to use.    Baseline SLOP= slow, loud, over enunciation, pause    Time 4    Period Weeks    Status On-going      SLP LONG TERM GOAL #4   Title Pt will produce /s/, /z/ accurately in all positions at sentence level  (excluding blends) using strategies 15/15 sentenes    Time 4    Period Weeks    Status On-going              Plan - 01/24/21 1603     Clinical Impression Statement Scaffolded articulation tasks of targeted work back to word level then to phrases as inconsistent placement for target sounds exhibited at word level intially. Frequent mod A for placement throughout all tasks, although some increased awareness of backward tongue placement noted. Continue skilled ST to improve articulation of s, z, and voiced and voiceless th including in clusters for success in podcast and public speaking    Speech Therapy Frequency 2x / week    Duration --   6 weeks or 9 visits   Treatment/Interventions Environmental controls;Cueing hierarchy;Functional tasks;Compensatory strategies;Compensatory techniques;Multimodal communcation approach;Internal/external aids;Patient/family education;SLP instruction and feedback             Patient will benefit from skilled therapeutic intervention in order to improve the following deficits and impairments:   Other speech disturbance    Problem List Patient Active Problem List   Diagnosis Date Noted   Seizure (HCC) 08/18/2020   Hyponatremia 08/18/2020   Hyperthyroidism 08/18/2020    Evgenia Merriman, Radene Journey MS, CCC-SLP 01/24/2021, 4:05 PM  [facility] [address] [place], [place], [address] Phone: [phone]   Fax:  [phone]   Name: Janet Dennis MRN: [id number] Date of Birth: July 23, 1980

## 2021-01-24 NOTE — Patient Instructions (Signed)
  She does solo episodes A music subscription service It has a nice selection of tracks He does summits I spoke on the salon launch My podcast is on [employer] I interviewed the author [employer] Word Rebellion  It helps communicate  your message Change the stigma of the word disabled Practice these slowly, really focusing on where the th, s and z sounds are and get your tongue out for th and behind  your teeth for s and z For your next podcast, find word, terms phrases with your target sounds and practice those or bring in some words  Remember, slow your rate of speech - when it is a proper noun or Title, make each word distinct

## 2021-01-26 ENCOUNTER — Ambulatory Visit: Payer: BC Managed Care – PPO | Admitting: Speech Pathology

## 2021-01-26 ENCOUNTER — Encounter: Payer: Self-pay | Admitting: Speech Pathology

## 2021-01-26 ENCOUNTER — Other Ambulatory Visit: Payer: Self-pay

## 2021-01-26 DIAGNOSIS — R4789 Other speech disturbances: Secondary | ICD-10-CM | POA: Diagnosis not present

## 2021-01-26 NOTE — Patient Instructions (Signed)
  Tongue Twisters [place]  Swiss wrist watch  Which wrist watches are swiss wrist watch The instinct of an extinct insect stinks [place] [employer] [place] Thistles Stick

## 2021-01-26 NOTE — Therapy (Signed)
[facility] [address] [place], [place], [address] Phone: [phone]   Fax:  [phone]  Speech Language Pathology Treatment  Patient Details  Name: Janet Dennis MRN: [id number] Date of Birth: [birth year] Referring Provider (SLP): Dr. 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   Subjective Assessment - 01/26/21 1456     Subjective "He said 1x a week, but it looks like it was set up to be twice a week"    Currently in Pain? 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 In 15 minimal pairs in isolation and cv syllables, Janet Dennis made distinguised sound for each 13/15x with usual min to mod verbal cues for placement. In personally relevant words and 2 word phrases with target sounds, Janet Dennis accurately produced sounds 12/15x, again with ongoing feedback and verbal cues for placement and airflow. Perceptually, s, z and th is improved, even when placement is not completely accurate. In simple conversation, placement of target sounds is 50%  with verbal cues/feedback. Janet Dennis is to practice words with target sounds that she will use in her next podcast. As she wants to do a podcase about hx of tongue twisters, I added twisters with target sounds to HEP, instructing her to do them slowly emphasizing her sounds.      Assessment / Recommendations / Plan   Plan Continue with current plan of care      Progression Toward Goals   Progression toward goals Progressing toward goals              SLP Education - 01/26/21 1543     Education Details HEP, SLOP (slow loud over articulate pause)    Person(s) Educated Patient    Methods Explanation;Demonstration;Verbal cues;Handout    Comprehension Verbalized understanding;Returned demonstration;Verbal cues required;Tactile cues required;Need further instruction                SLP Long Term Goals - 01/26/21 1546       SLP LONG TERM GOAL #1   Title Pt will demonstrate correct placement and production  /s/ and /z/ in isolation 20/20x each with rare min A    Time 4    Period Weeks    Status On-going      SLP LONG TERM GOAL #  2   Title Pt will produce /s/ and /z/ at word level in initial and final positions 15/20x each with rare min A    Time 4    Period Weeks    Status On-going      SLP LONG TERM GOAL #3   Title Pt will carryover 3 strategies (SLOP) to maximize intelligiblity for voice commands to computer on 10 personall relevant sentences she wants to use.    Baseline SLOP= slow, loud, over enunciation, pause    Time 4    Period Weeks    Status On-going      SLP LONG TERM GOAL #4   Title Pt will produce /s/, /z/ accurately in all positions at sentence level (excluding blends) using strategies 15/15 sentenes    Time 4    Period Weeks    Status On-going              Plan - 01/26/21 1543     Clinical Impression Statement Janet Dennis is progressing with accurate placement of s, z, th in words and phrases practiced in ST and HEP. In simple conversation, placement  accurate 50%, however perceptual improvement noted in conversation. She continues to require frequent min to mod A for placement and airflow. Continue skilled ST to improve articulation of s,z, and voice and voiceless th in all positions for improved intelligiblity and success in podcast and public speaking.    Speech Therapy Frequency 2x / week    Duration --   6 weeks or 9 visits   Treatment/Interventions Environmental controls;Cueing hierarchy;Functional tasks;Compensatory strategies;Compensatory techniques;Multimodal communcation approach;Internal/external aids;Patient/family education;SLP instruction and feedback    Potential to Achieve Goals Fair    Potential Considerations Co-morbidities;Previous level of function    SLP Home Exercise Plan provided    Consulted and Agree with Plan of Care Patient             Patient will benefit from skilled therapeutic intervention in order to improve the following deficits and impairments:   Other speech disturbance    Problem List Patient Active Problem List   Diagnosis Date Noted   Seizure (HCC) 08/18/2020   Hyponatremia 08/18/2020   Hyperthyroidism 08/18/2020    Janet Dennis, Radene Journey MS, CCC-SLP 01/26/2021, 3:49 PM  [facility] [address] [place], [place], [address] Phone: [phone]   Fax:  [phone]   Name: Janet Dennis MRN: [id number] Date of Birth: 03/16/1981

## 2021-01-31 ENCOUNTER — Other Ambulatory Visit: Payer: Self-pay

## 2021-01-31 ENCOUNTER — Ambulatory Visit: Payer: BC Managed Care – PPO | Admitting: Speech Pathology

## 2021-01-31 ENCOUNTER — Ambulatory Visit: Payer: BC Managed Care – PPO

## 2021-01-31 DIAGNOSIS — R2689 Other abnormalities of gait and mobility: Secondary | ICD-10-CM

## 2021-01-31 DIAGNOSIS — R2681 Unsteadiness on feet: Secondary | ICD-10-CM

## 2021-01-31 DIAGNOSIS — R4789 Other speech disturbances: Secondary | ICD-10-CM | POA: Diagnosis not present

## 2021-01-31 NOTE — Patient Instructions (Signed)
Access Code: [email or web address] URL: [email or web address] Date: 01/31/2021 Prepared by: Gustavus Bryant  Exercises Sit to Stand - 2 x daily - 7 x weekly - 1 sets - 5 reps Single Leg Stance - 2 x daily - 7 x weekly - 1 sets - 2 reps - 30 hold Standing March with Counter Support - 2 x daily - 7 x weekly - 2 sets - 10 reps Heel Toe Raises with Counter Support - 2 x daily - 7 x weekly - 2 sets - 10 reps

## 2021-01-31 NOTE — Therapy (Signed)
[facility] [address] [place], [place], [address] Phone: [phone]   Fax:  [phone]  Physical Therapy Treatment  Patient Details  Name: Janet Dennis MRN: [id number] Date of Birth: [birth year] No data recorded  Encounter Date: 01/31/2021   PT End of Session - 01/31/21 1524     Visit Number 2    Number of Visits 9    Date for PT Re-Evaluation 03/28/21    Authorization Type [employer]    Authorization Time Period 01/24/21-03/28/21    Progress Note Due on Visit 9    PT Start Time 1445    PT Stop Time 1530    PT Time Calculation (min) 45 min    Activity Tolerance Patient tolerated treatment well    Behavior During Therapy [facility] for tasks assessed/performed             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   Subjective Assessment - 01/31/21 1454     Subjective No falls or LOB noted since last session.  Feels her main issues are balance related.    Pertinent History Pt is a 40 year old female referred to OPP[facility] for evaluation and treatment with Referring Diagnosis: Abnormality of gait due to impairment of balance. Pt presents with physical impairments of impaired activity tolerance, impaired standing balance, impaired motor coordination, and decreased safety awareness impacting safe and independent functional mobility. Examination revealed pt is at risk for falls and functional decline as evidenced by the following objective test measures: of 355ft (norm for patient age and gender 510ft), TUG of 16sec (>12sec indicates increased risk for falls), and 5x sit to stand of 22sec (>15sec indicates  increased risk for falls and decreased BLE power). Patient's father reports he is her transportation and he will be going back to work soon (works in [employer] and summer break ends early next week). She takes public transportation in [place] city but they will not bring her here. They are waiting on referral from PCP to clinic in [place] so will be placed on hold at this time.    Limitations Walking    How long can you sit comfortably? unlimited    How long can you stand comfortably? <15 min    How long can you walk comfortably? <15 min    Patient Stated Goals to regain her balance    Currently in Pain? No/denies                               [facility] Adult PT Treatment/Exercise - 01/31/21 0001       Transfers   Transfers Sit to Stand    Comments 5x HEP review      Ambulation/Gait   Ambulation/Gait Yes    Ambulation/Gait Assistance 4: Min guard    Ambulation Distance (Feet) 115 Feet    Gait Pattern Step-through pattern    Pre-Gait Activities uses vision impaired cane      Knee/Hip Exercises: Standing   Hip Flexion  Stengthening;Both;1 set;10 reps;Limitations    Hip Flexion Limitations BUE support      Ankle Exercises: Standing   Heel Raises Both;10 reps;Limitations    Heel Raises Limitations UE support    Toe Raise 10 reps;Limitations    Toe Raise Limitations B UE support                 Balance Exercises - 01/31/21 0001       Balance Exercises: Standing   SLS Eyes closed;1 rep;30 secs;Limitations    SLS Limitations B UE support                 PT Short Term Goals - 01/24/21 1501       PT SHORT TERM GOAL #1   Title Patient to demo I in initial HEP    Baseline [email or web address]    Time 4    Period Weeks    Status New    Target Date 02/28/21      PT SHORT TERM GOAL #2   Title Assess FGA and set appropriate goal    Baseline TBD    Time 4    Period Weeks    Status New    Target Date 02/28/21      PT SHORT TERM GOAL #3    Title Improve 5x STS test score to <13s    Baseline 16.9s    Time 4    Period Weeks    Status New    Target Date 02/28/21               PT Long Term Goals - 01/24/21 1503       PT LONG TERM GOAL #1   Title Patient to negotiate 16 steps with single rail and step through pattern    Baseline 4 steps with step to pattern and B rails    Time 8    Period Weeks    Status New    Target Date 03/28/21      PT LONG TERM GOAL #2   Title Assess progress towards FGA goal    Baseline TBD    Time 8    Period Weeks    Status New    Target Date 03/28/21      PT LONG TERM GOAL #3   Title Patient to ambulate across outdoor surfaces with S    Baseline In clinic ambulation arm-in-arm with therapist    Time 8    Period Weeks    Status New    Target Date 03/28/21                   Plan - 01/31/21 1527     Clinical Impression Statement Todays session focused on assessment of gait with vision impaired cane, review of HEP and discussion of treatment strategies in negotiating home environment and performing HEP w/o assist of CGS.  patient Dennis difficulty with spatial orientation but no LOB noted    Personal Factors and Comorbidities Comorbidity 1    Comorbidities legally blind    Examination-Activity Limitations Locomotion Level    Stability/Clinical Decision Making Stable/Uncomplicated    Rehab Potential Good    PT Frequency 1x / week    PT Duration 8 weeks    PT Treatment/Interventions ADLs/Self Care Home Management;Aquatic Therapy;DME Instruction;Gait training;Stair training;Functional mobility training;Therapeutic activities;Therapeutic exercise;Balance training;Neuromuscular re-education;Patient/family education    PT Next Visit Plan Expand HEP, assessDGI/FGA assess ability to negotiate environment    PT Home Exercise Plan [facility]    Consulted  and Agree with Plan of Care Patient             Patient will benefit from skilled therapeutic intervention in order to  improve the following deficits and impairments:  Abnormal gait, Difficulty walking, Decreased endurance, Decreased safety awareness, Decreased activity tolerance, Decreased balance, Decreased strength  Visit Diagnosis: Unsteadiness on feet  Balance disorder  Other abnormalities of gait and mobility     Problem List Patient Active Problem List   Diagnosis Date Noted   Seizure (HCC) 08/18/2020   Hyponatremia 08/18/2020   Hyperthyroidism 08/18/2020    Hildred Laser PT 01/31/2021, 3:29 PM  [facility] [address] [place], [place], [address] Phone: [phone]   Fax:  [phone]  Name: Janet Dennis MRN: [id number] Date of Birth: [birth year]

## 2021-02-02 ENCOUNTER — Encounter: Payer: Self-pay | Admitting: Speech Pathology

## 2021-02-02 ENCOUNTER — Ambulatory Visit: Payer: BC Managed Care – PPO | Admitting: Speech Pathology

## 2021-02-02 ENCOUNTER — Other Ambulatory Visit: Payer: Self-pay

## 2021-02-02 ENCOUNTER — Ambulatory Visit: Payer: BC Managed Care – PPO

## 2021-02-02 DIAGNOSIS — R4789 Other speech disturbances: Secondary | ICD-10-CM | POA: Diagnosis not present

## 2021-02-02 NOTE — Therapy (Signed)
[facility] [address] [place], [place], [address] Phone: [phone]   Fax:  [phone]  Speech Language Pathology Treatment & Discharge Summary  Patient Details  Name: Janet Dennis MRN: [id number] Date of Birth: 05-17-1981 Referring Provider (SLP): Dr. Thyra Breed   Encounter Date: 02/02/2021   End of Session - 02/02/21 1714     Visit Number 8    Number of Visits 9    Date for SLP Re-Evaluation 02/16/21    SLP Start Time 1517    SLP Stop Time  1445    SLP Time Calculation (min) 42 min    Activity Tolerance Patient tolerated treatment well             Past Medical History:  Diagnosis Date   Bilateral hearing loss    Blind 1982   Brain bleed ([place])    After birth   Premature baby    Seizures ([place]) 08/2020    Past Surgical History:  Procedure Laterality Date   PATENT DUCTUS ARTERIOUS REPAIR     as infant   SCAR REVISION     WISDOM TOOTH EXTRACTION      There were no vitals filed for this visit.   Subjective Assessment - 02/02/21 1410     Subjective "I don't understand why, I don't know what happended" re: missing Monday    Currently in Pain? 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 Practice slowing down her name, using syllables, and increasing volume. Added to HEP s,z, th sentences. Janet Dennis uses accurate placement in personally relevant sentences with slow rate and occasional min verbal cues. She is able to self correct when she focuses on placement and slows her rate of speech. Janet Dennis will continue to practice and ID target sounds in upcoming podcasts to practice to improve acccuracy  of target sounds.      Assessment / Recommendations / Plan   Plan Discharge SLP treatment due to (comment)      Progression Toward Goals   Progression toward goals Goals met, education completed, patient discharged from Janet Dennis Education - 02/02/21 1710     Education Details Continue to practice at home personally relevant phrases as well as pre practice podcast words with target sounds    Person(s) Educated Patient    Methods Explanation;Demonstration;Verbal cues;Handout    Comprehension Verbalized understanding;Returned demonstration            SPEECH THERAPY DISCHARGE SUMMARY  Visits from Start of Care: 8  Current functional level related to goals / functional outcomes:  See goals below   Remaining deficits: Mild developmental articulation     Education / Equipment: Placement and production of s, z, th, compensations for intelligibility   Patient agrees to discharge. Patient goals were met. Patient is being discharged due to meeting the stated rehab goals.Marland Kitchen       SLP Long Term Goals - 02/02/21 1713       SLP LONG TERM GOAL #1   Title Pt will demonstrate correct placement and production  /s/ and /z/ in isolation  20/20x each with rare min A    Time 4    Period Weeks    Status Achieved      SLP LONG TERM GOAL #2   Title Pt will produce /s/ and /z/ at word level in initial and final positions 15/20x each with rare min A    Time 4    Period Weeks    Status Achieved      SLP LONG TERM GOAL #3   Title Pt will carryover 3 strategies (SLOP) to maximize intelligiblity for voice commands to computer on 10 personall relevant sentences she wants to use.    Baseline SLOP= slow, loud, over enunciation, pause    Time 4    Period Weeks    Status Achieved      SLP LONG TERM GOAL #4   Title Pt will produce /s/, /z/ accurately in all positions at sentence level (excluding blends) using strategies 15/15 sentenes    Time 4    Period Weeks    Status  Partially Met              Plan - 02/02/21 1711     Clinical Impression Statement Janet Dennis achieves correct placement of target  sounds at word level and short phrase level. She is demonstrating differentiated placement for s/z vs th. Perceptual improvement in target sounds. She is mod I in HEP and will continue to practice at home. She is carrying over slow rate, over articulation and pausing to improve intelligilbity. Goals met, education complete, d/c ST at this time. Pt in agreement    Speech Therapy Frequency Monthly    Duration --   6 weeks or 9 visits   Treatment/Interventions Environmental controls;Cueing hierarchy;Functional tasks;Compensatory strategies;Compensatory techniques;Multimodal communcation approach;Internal/external aids;Patient/family education;SLP instruction and feedback    Potential Considerations Co-morbidities;Previous level of function             Patient will benefit from skilled therapeutic intervention in order to improve the following deficits and impairments:   Other speech disturbance    Problem List Patient Active Problem List   Diagnosis Date Noted   Seizure ([place]) 08/18/2020   Hyponatremia 08/18/2020   Hyperthyroidism 08/18/2020    Janet Dennis, Annye Rusk MS, [place] 02/02/2021, 5:14 PM  [place] [address] [place], [place], [address] Phone: [phone]   Fax:  [phone]   Name: Janet Dennis MRN: [id number] Date of Birth: 09/07/1980

## 2021-02-02 NOTE — Patient Instructions (Signed)
  Practice your name slow, use syllables if needed - make sure you get the z sound at the end of [place] - focus on your tongue placement  Erskine Speed Lemon bars at [employer] I have Bible Study on Saturdays My cousins are out of state  Recordings should be clean We listen to Delrae Rend We also listen  to [employer] music She sends me a Bible verse Sound placement is important My birthday is [birth year]  SCAT drives  around [employer] are interesting Practice your s's and z's Thank goodness I'm done with speech therapy Clothing sites are not accessible  [employer] [place] [employer] Cinnamon aluminum linoleum [address] [place]

## 2021-02-09 ENCOUNTER — Ambulatory Visit: Payer: BC Managed Care – PPO | Attending: Family Medicine

## 2021-02-09 ENCOUNTER — Other Ambulatory Visit: Payer: Self-pay

## 2021-02-09 DIAGNOSIS — R2689 Other abnormalities of gait and mobility: Secondary | ICD-10-CM | POA: Insufficient documentation

## 2021-02-09 DIAGNOSIS — R2681 Unsteadiness on feet: Secondary | ICD-10-CM | POA: Diagnosis present

## 2021-02-10 NOTE — Therapy (Signed)
[facility] [address] [place], [place], [address] Phone: [phone]   Fax:  [phone]  Physical Therapy Treatment  Patient Details  Name: Janet Dennis MRN: [id number] Date of Birth: 1981-05-24 No data recorded  Encounter Date: 02/09/2021   PT End of Session - 02/09/21 1448     Visit Number 3    Number of Visits 9    Date for PT Re-Evaluation 03/28/21    Authorization Type BCBS    Authorization Time Period 01/24/21-03/28/21    Progress Note Due on Visit 9    PT Start Time 1445    PT Stop Time 1531    PT Time Calculation (min) 46 min    Activity Tolerance Patient tolerated treatment well    Behavior During Therapy 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   Subjective Assessment - 02/09/21 1448     Subjective Pt reports that the website for the HEP was not accessible as she can not see with only light perception.    Pertinent History Pt is a 40 year old female referred to OPPT for evaluation and treatment with Referring Diagnosis: Abnormality of gait due to impairment of balance. Pt presents with physical impairments of impaired activity tolerance, impaired standing balance, impaired motor coordination, and decreased safety awareness impacting safe and independent functional mobility. Examination revealed pt is at risk for falls and functional decline as evidenced by the following objective test measures: of 337ft (norm for patient age and gender 574ft), TUG of 16sec (>12sec indicates increased risk for falls), and 5x sit to stand of 22sec  (>15sec indicates increased risk for falls and decreased BLE power). Patient's father reports he is her transportation and he will be going back to work soon (works in [employer] and summer break ends early next week). She takes public transportation in neighboring city but they will not bring her here. They are waiting on referral from PCP to clinic in [place] so will be placed on hold at this time.    Limitations Walking    How long can you sit comfortably? unlimited    How long can you stand comfortably? <15 min    How long can you walk comfortably? <15 min    Patient Stated Goals to regain her balance    Currently in Pain? No/denies                [facility] PT Assessment - 02/09/21 1453       Functional Gait  Assessment   Gait assessed  Yes    FGA comment: PT attempted tasks for FGA with pt using blind stick but pt unable to perform without significant assistance to guide due to being blind and was not a good measure of what she can do.                           [facility] Adult PT Treatment/Exercise -  02/09/21 1453       Transfers   Transfers Sit to Stand;Stand to Sit    Sit to Stand 5: Supervision;4: Min guard    Sit to Stand Details Verbal cues for technique    Sit to Stand Details (indicate cue type and reason) Pt was cued to lean forward and not shoot up so quick as leans posterior at times.    Stand to Sit 5: Supervision      Ambulation/Gait   Ambulation/Gait Yes    Ambulation/Gait Assistance 4: Min guard    Ambulation/Gait Assistance Details around in clinic during session using blind cane and with PT guiding by arm for direction.    Assistive device --   blind cane   Gait Pattern Step-through pattern;Decreased step length - right;Decreased step length - left    Stairs Yes    Stairs Assistance 5: Supervision;4: Min guard    Stair Management Technique One rail Right;Alternating pattern   blind stick in left hand   Number of Stairs 4    Height of  Stairs 6      Neuro Re-ed    Neuro Re-ed Details  Standing on airex: feet apart x 30 sec, head turns left/right x 10 each, feet together x 30 sec. Walking over blue mat with blind stick 6' x 4 CGA. Marching in place without UE support x 10 with cues to try to control steps more and not stomp feet down. Less stance time on right. Tandem stance x 30 sec each position with more difficulty on right. Sit to stand from chair with airex under feet 5 x 2 with CGA/min assist at times with verbal cues to lean forward and take time. Pt does tend to raise up too quickly and throw back at times. Sit to stand x 5 on floor with good form. PT reviewed exercises from HEP as noted below at counter as well so that pt can remember them as can't see to look at paper. Pt able to repeat them back to therapist at end.                     PT Education - 02/10/21 0817     Education Details Reviewed HEP    Person(s) Educated Patient    Methods Explanation    Comprehension Verbalized understanding;Returned demonstration              PT Short Term Goals - 02/10/21 0818       PT SHORT TERM GOAL #1   Title Patient to demo I in initial HEP    Baseline [id number]    Time 4    Period Weeks    Status New    Target Date 02/28/21      PT SHORT TERM GOAL #2   Title Assess FGA and set appropriate goal    Baseline 02/09/21 PT attempted FGA but due to blindness and needing guiding for direction not a good assessment for patient.    Time 4    Period Weeks    Status Deferred    Target Date 02/28/21      PT SHORT TERM GOAL #3   Title Improve 5x STS test score to <13s    Baseline 16.9s    Time 4    Period Weeks    Status New    Target Date 02/28/21               PT Long Term Goals - 01/24/21 1503  PT LONG TERM GOAL #1   Title Patient to negotiate 16 steps with single rail and step through pattern    Baseline 4 steps with step to pattern and B rails    Time 8    Period Weeks    Status New     Target Date 03/28/21      PT LONG TERM GOAL #2   Title Assess progress towards FGA goal    Baseline TBD    Time 8    Period Weeks    Status New    Target Date 03/28/21      PT LONG TERM GOAL #3   Title Patient to ambulate across outdoor surfaces with S    Baseline In clinic ambulation arm-in-arm with therapist    Time 8    Period Weeks    Status New    Target Date 03/28/21                   Plan - 02/10/21 0819     Clinical Impression Statement PT attempted FGA that was in plan but due to pt's lack of vision was not going to get a good measure for pt so stopped half way through. Focused on reviewing HEP so pt could remember just 4 exercise as can not see the paper to use. Pt was most challenged with SLS tasks on RLE.    Personal Factors and Comorbidities Comorbidity 1    Comorbidities legally blind    Examination-Activity Limitations Locomotion Level    Stability/Clinical Decision Making Stable/Uncomplicated    Rehab Potential Good    PT Frequency 1x / week    PT Duration 8 weeks    PT Treatment/Interventions ADLs/Self Care Home Management;Aquatic Therapy;DME Instruction;Gait training;Stair training;Functional mobility training;Therapeutic activities;Therapeutic exercise;Balance training;Neuromuscular re-education;Patient/family education    PT Next Visit Plan Continue with balance training especially tasks to increase right SLS stability.    PT Home Exercise Plan [id number]    Consulted and Agree with Plan of Care Patient             Patient will benefit from skilled therapeutic intervention in order to improve the following deficits and impairments:  Abnormal gait, Difficulty walking, Decreased endurance, Decreased safety awareness, Decreased activity tolerance, Decreased balance, Decreased strength  Visit Diagnosis: Other abnormalities of gait and mobility  Unsteadiness on feet     Problem List Patient Active Problem List   Diagnosis Date Noted    Seizure (HCC) 08/18/2020   Hyponatremia 08/18/2020   Hyperthyroidism 08/18/2020    Ronn Melena, PT, DPT, NCS 02/10/2021, 8:24 AM  [facility] [address] [place], [place], [address] Phone: [phone]   Fax:  [phone]  Name: Ariauna Farabee MRN: [id number] Date of Birth: Jan 14, 1981

## 2021-02-14 ENCOUNTER — Other Ambulatory Visit: Payer: Self-pay

## 2021-02-14 ENCOUNTER — Ambulatory Visit: Payer: BC Managed Care – PPO

## 2021-02-14 DIAGNOSIS — R2689 Other abnormalities of gait and mobility: Secondary | ICD-10-CM | POA: Diagnosis not present

## 2021-02-14 DIAGNOSIS — R2681 Unsteadiness on feet: Secondary | ICD-10-CM

## 2021-02-14 NOTE — Therapy (Signed)
[facility] [address] [place], [place], [address] Phone: [phone]   Fax:  [phone]  Physical Therapy Treatment  Patient Details  Name: Janet Dennis MRN: [id number] Date of Birth: [birth year] No data recorded  Encounter Date: 02/14/2021   PT End of Session - 02/14/21 1322     Visit Number 4    Number of Visits 9    Date for PT Re-Evaluation 03/28/21    Authorization Type BCBS    Authorization Time Period 01/24/21-03/28/21    Progress Note Due on Visit 9    PT Start Time 1320    PT Stop Time 1400    PT Time Calculation (min) 40 min    Equipment Utilized During Treatment Gait belt    Activity Tolerance Patient tolerated treatment well    Behavior During Therapy [facility] for tasks assessed/performed             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   Subjective Assessment - 02/14/21 1321     Subjective No changes to report.    Pertinent History Pt is a 40 year old female referred to OPPT for evaluation and treatment with Referring Diagnosis: Abnormality of gait due to impairment of balance. Pt presents with physical impairments of impaired activity tolerance, impaired standing balance, impaired motor coordination, and decreased safety awareness impacting safe and independent functional mobility. Examination revealed pt is at risk for falls and functional decline as evidenced by the following objective test measures: of 364ft (norm for patient age and gender 547ft), TUG of 16sec (>12sec indicates increased risk for falls), and 5x sit to stand of 22sec (>15sec indicates increased risk  for falls and decreased BLE power). Patient's father reports he is her transportation and he will be going back to work soon (works in [employer] and summer break ends early next week). She takes public transportation in neighboring city but they will not bring her here. They are waiting on referral from PCP to clinic in [place] so will be placed on hold at this time.    Limitations Walking    How long can you sit comfortably? unlimited    How long can you stand comfortably? <15 min    How long can you walk comfortably? <15 min    Patient Stated Goals to regain her balance                               [facility] Adult PT Treatment/Exercise - 02/14/21 0001       Transfers   Transfers Sit to Stand;Stand to Sit    Sit to Stand 5: Supervision;4: Min guard    Sit to Stand Details Verbal cues for technique    Stand to Sit 5: Supervision    Comments HEP review, 5x      Ambulation/Gait   Ambulation/Gait Yes    Ambulation/Gait Assistance 4: Min guard    Ambulation/Gait Assistance Details through clinic cuing as needed for obsatcle avoidance with  blind cane    Ambulation Distance (Feet) 150 Feet    Gait Pattern Step-through pattern;Decreased step length - right;Decreased step length - left    Stairs Yes    Stairs Assistance 5: Supervision;4: Min guard    Stair Management Technique One rail Left    Number of Stairs 16    Height of Stairs 6    Gait Comments blind cane used      Knee/Hip Exercises: Standing   Hip Flexion Stengthening;Both;1 set;10 reps;Limitations    Hip Flexion Limitations BUE support      Ankle Exercises: Standing   Heel Raises Both;10 reps;Limitations    Heel Raises Limitations UE support    Toe Raise 10 reps;Limitations    Toe Raise Limitations B UE support                 Balance Exercises - 02/14/21 0001       Balance Exercises: Standing   Standing Eyes Closed Wide (BOA);Head turns;Foam/compliant surface;5 reps;30  secs;Limitations    Standing Eyes Closed Limitations 30s ststic stand followed by 5 head turns/nods    Tandem Stance Eyes closed;Foam/compliant surface;30 secs;Limitations    Tandem Stance Time performed in ea. tandem position    SLS Eyes closed;1 rep;30 secs;Limitations    SLS Limitations B UE support    Stepping Strategy Anterior;Lateral;10 reps;Limitations    Stepping Strategy Limitations no UE support, stepping 10x ea. LE in fwd and lat directions                  PT Short Term Goals - 02/10/21 0818       PT SHORT TERM GOAL #1   Title Patient to demo I in initial HEP    Baseline [id number]    Time 4    Period Weeks    Status New    Target Date 02/28/21      PT SHORT TERM GOAL #2   Title Assess FGA and set appropriate goal    Baseline 02/09/21 PT attempted FGA but due to blindness and needing guiding for direction not a good assessment for patient.    Time 4    Period Weeks    Status Deferred    Target Date 02/28/21      PT SHORT TERM GOAL #3   Title Improve 5x STS test score to <13s    Baseline 16.9s    Time 4    Period Weeks    Status New    Target Date 02/28/21               PT Long Term Goals - 01/24/21 1503       PT LONG TERM GOAL #1   Title Patient to negotiate 16 steps with single rail and step through pattern    Baseline 4 steps with step to pattern and B rails    Time 8    Period Weeks    Status New    Target Date 03/28/21      PT LONG TERM GOAL #2   Title Assess progress towards FGA goal    Baseline TBD    Time 8    Period Weeks    Status New    Target Date 03/28/21      PT LONG TERM GOAL #3   Title Patient to ambulate across outdoor surfaces with S    Baseline In clinic ambulation arm-in-arm with therapist    Time 8    Period Weeks    Status New  Target Date 03/28/21                   Plan - 02/14/21 1359     Clinical Impression Statement Todays session consisted of HEP review as patient vision impaired and  unable to follow written directions accurately.  She was able to demo all 4 exercises with minimal cuing.  continued balance and proprioceptive tasks on compliant surfaces with tandem and normal stance positions adding head turns/nods to further challenge her balance.  Paticipated in negotiating a full flight of steps and ambulating in clinic with blind cane to practice obstacle avoidance    Personal Factors and Comorbidities Comorbidity 1    Comorbidities legally blind    Examination-Activity Limitations Locomotion Level    Stability/Clinical Decision Making Stable/Uncomplicated    Rehab Potential Good    PT Frequency 1x / week    PT Duration 8 weeks    PT Treatment/Interventions ADLs/Self Care Home Management;Aquatic Therapy;DME Instruction;Gait training;Stair training;Functional mobility training;Therapeutic activities;Therapeutic exercise;Balance training;Neuromuscular re-education;Patient/family education    PT Next Visit Plan Continue with balance training especially tasks to increase right SLS stability and mobility through environment    PT Home Exercise Plan [id number]    Consulted and Agree with Plan of Care Patient             Patient will benefit from skilled therapeutic intervention in order to improve the following deficits and impairments:  Abnormal gait, Difficulty walking, Decreased endurance, Decreased safety awareness, Decreased activity tolerance, Decreased balance, Decreased strength  Visit Diagnosis: Other abnormalities of gait and mobility  Unsteadiness on feet  Balance disorder     Problem List Patient Active Problem List   Diagnosis Date Noted   Seizure (HCC) 08/18/2020   Hyponatremia 08/18/2020   Hyperthyroidism 08/18/2020    Hildred Laser, PT 02/14/2021, 2:07 PM  [facility] [address] [place], [place], [address] Phone: [phone]   Fax:  [phone]  Name: Janet Dennis MRN: [id number] Date of Birth: December 17, 1980

## 2021-02-21 ENCOUNTER — Ambulatory Visit: Payer: BC Managed Care – PPO

## 2021-02-21 ENCOUNTER — Other Ambulatory Visit: Payer: Self-pay

## 2021-02-21 DIAGNOSIS — R2689 Other abnormalities of gait and mobility: Secondary | ICD-10-CM | POA: Diagnosis not present

## 2021-02-21 DIAGNOSIS — R2681 Unsteadiness on feet: Secondary | ICD-10-CM

## 2021-02-21 NOTE — Therapy (Signed)
[facility] [address] [place], [place], [address] Phone: [phone]   Fax:  [phone]  Physical Therapy Treatment  Patient Details  Name: Janet Dennis MRN: [id number] Date of Birth: Apr 11, 1981 No data recorded  Encounter Date: 02/21/2021   PT End of Session - 02/21/21 1322     Visit Number 5    Number of Visits 9    Date for PT Re-Evaluation 03/28/21    Authorization Type BCBS    Authorization Time Period 01/24/21-03/28/21    Progress Note Due on Visit 9    PT Start Time 1315    PT Stop Time 1400    PT Time Calculation (min) 45 min    Equipment Utilized During Treatment Gait belt    Activity Tolerance Patient tolerated treatment well    Behavior During Therapy [facility] for tasks assessed/performed             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   Subjective Assessment - 02/21/21 1320     Subjective Rates herself at 75%.    Pertinent History Pt is a 40 year old female referred to OPPT for evaluation and treatment with Referring Diagnosis: Abnormality of gait due to impairment of balance. Pt presents with physical impairments of impaired activity tolerance, impaired standing balance, impaired motor coordination, and decreased safety awareness impacting safe and independent functional mobility. Examination revealed pt is at risk for falls and functional decline as evidenced by the following objective test measures: of 373ft (norm for patient age and gender 533ft), TUG of 16sec (>12sec indicates increased risk for falls), and 5x sit to stand of 22sec (>15sec indicates increased risk  for falls and decreased BLE power). Patient's father reports he is her transportation and he will be going back to work soon (works in [employer] and summer break ends early next week). She takes public transportation in neighboring city but they will not bring her here. They are waiting on referral from PCP to clinic in [place] so will be placed on hold at this time.    Limitations Walking    How long can you sit comfortably? unlimited    How long can you stand comfortably? <15 min    How long can you walk comfortably? <15 min    Patient Stated Goals to regain her balance                               [facility] Adult PT Treatment/Exercise - 02/21/21 0001       Transfers   Transfers Sit to Stand;Stand to Sit    Sit to Stand 6: Modified independent (Device/Increase time)    Stand to Sit 6: Modified independent (Device/Increase time)      Ambulation/Gait   Ambulation/Gait Yes    Ambulation/Gait Assistance 6: Modified independent (Device/Increase time)    Ambulation Distance (Feet) 500 Feet    Gait Pattern Step-through pattern;Decreased step length - right;Decreased step length - left    Stairs  Yes    Stairs Assistance 6: Modified independent (Device/Increase time)    Stair Management Technique One rail Left    Number of Stairs 16    Height of Stairs 6    Gait Comments cane used      Knee/Hip Exercises: Aerobic   Stepper Scifit L1 x8' seat 17arms 7                 Balance Exercises - 02/21/21 0001       Balance Exercises: Standing   Tandem Stance Eyes open;1 rep;30 secs    Tandem Stance Time performed in ea. tandem position    Stepping Strategy Anterior;Lateral;10 reps;Limitations    Stepping Strategy Limitations no UE support, stepping 10x ea. LE in fwd and lat directions    Other Standing Exercises reaching to touch floor 5x                  PT Short Term Goals - 02/21/21 1324       PT SHORT TERM GOAL #1   Title Patient to demo I  in initial HEP    Baseline [id number]; 02/21/21 Able to demo HEP w/o cues    Time 4    Period Weeks    Status Achieved    Target Date 02/28/21      PT SHORT TERM GOAL #2   Title Assess FGA and set appropriate goal    Baseline 02/09/21 PT attempted FGA but due to blindness and needing guiding for direction not a good assessment for patient.    Time 4    Period Weeks    Status Deferred    Target Date 02/28/21      PT SHORT TERM GOAL #3   Title Improve 5x STS test score to <13s    Baseline 16.9s; 02/21/21 12.4s    Time 4    Period Weeks    Status Achieved    Target Date 02/28/21               PT Long Term Goals - 01/24/21 1503       PT LONG TERM GOAL #1   Title Patient to negotiate 16 steps with single rail and step through pattern    Baseline 4 steps with step to pattern and B rails    Time 8    Period Weeks    Status New    Target Date 03/28/21      PT LONG TERM GOAL #2   Title Assess progress towards FGA goal    Baseline TBD    Time 8    Period Weeks    Status New    Target Date 03/28/21      PT LONG TERM GOAL #3   Title Patient to ambulate across outdoor surfaces with S    Baseline In clinic ambulation arm-in-arm with therapist    Time 8    Period Weeks    Status New    Target Date 03/28/21                   Plan - 02/21/21 1352     Clinical Impression Statement Todays session focused on goal progress, assessment of stair negotiation, outside ambulation and aerobic training.  PAtient able to negotiate a full flight of stairs using hre cane and a single rail L, ambulate across level ground outside and perfrom a squat to touch the ground safely.  Patient rates her function at 75% or better.    Personal Factors  and Comorbidities Comorbidity 1    Comorbidities legally blind    Examination-Activity Limitations Locomotion Level    Stability/Clinical Decision Making Stable/Uncomplicated    Rehab Potential Good    PT Frequency 1x / week    PT Duration 8  weeks    PT Treatment/Interventions ADLs/Self Care Home Management;Aquatic Therapy;DME Instruction;Gait training;Stair training;Functional mobility training;Therapeutic activities;Therapeutic exercise;Balance training;Neuromuscular re-education;Patient/family education    PT Next Visit Plan Assess any further deficits and DC as appropriate    PT Home Exercise Plan [id number]    Consulted and Agree with Plan of Care Patient             Patient will benefit from skilled therapeutic intervention in order to improve the following deficits and impairments:  Abnormal gait, Difficulty walking, Decreased endurance, Decreased safety awareness, Decreased activity tolerance, Decreased balance, Decreased strength  Visit Diagnosis: Other abnormalities of gait and mobility  Unsteadiness on feet  Balance disorder     Problem List Patient Active Problem List   Diagnosis Date Noted   Seizure (HCC) 08/18/2020   Hyponatremia 08/18/2020   Hyperthyroidism 08/18/2020    Hildred Laser, PT 02/21/2021, 2:12 PM  [facility] [address] [place], [place], [address] Phone: [phone]   Fax:  [phone]  Name: Lashaya Kienitz MRN: [id number] Date of Birth: 11-30-1980

## 2021-02-28 ENCOUNTER — Ambulatory Visit: Payer: BC Managed Care – PPO

## 2021-03-04 ENCOUNTER — Ambulatory Visit: Payer: BC Managed Care – PPO

## 2021-03-04 ENCOUNTER — Other Ambulatory Visit: Payer: Self-pay

## 2021-03-04 DIAGNOSIS — R2689 Other abnormalities of gait and mobility: Secondary | ICD-10-CM | POA: Diagnosis not present

## 2021-03-04 DIAGNOSIS — R2681 Unsteadiness on feet: Secondary | ICD-10-CM

## 2021-03-04 NOTE — Therapy (Signed)
[facility] [address] [place], [place], [address] Phone: [phone]   Fax:  [phone]  Physical Therapy Treatment/DC Summary  Patient Details  Name: Janet Dennis MRN: [id number] Date of Birth: December 07, 1980 No data recorded  Encounter Date: 03/04/2021 PHYSICAL THERAPY DISCHARGE SUMMARY  Visits from Start of Care: 6  Current functional level related to goals / functional outcomes: All rehab goals met   Remaining deficits: Vision loss   Education / Equipment: HEP and blind cane   Patient agrees to discharge. Patient goals were met. Patient is being discharged due to being pleased with the current functional level.    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   Subjective Assessment - 03/04/21 1057     Subjective Rates herself at 90%, feels confident to DC to independent management.    Limitations Walking    How long can you sit comfortably? unlimited    How long can you stand comfortably? <15 min    How long can you walk comfortably? <15 min    Patient Stated Goals to regain her balance    Currently in Pain?  No/denies                [facility] PT Assessment - 03/04/21 0001       Transfers   Transfers Sit to Stand;Stand to Sit    Sit to Stand 6: Modified independent (Device/Increase time)    Five time sit to stand comments  11.6s    Stand to Sit 6: Modified independent (Device/Increase time)      Dynamic Gait Index   Level Surface Normal    Change in Gait Speed Normal    Gait with Horizontal Head Turns Normal    Gait with Vertical Head Turns Normal    Gait and Pivot Turn Normal    Step Over Obstacle Moderate Impairment    Step Around Obstacles Mild Impairment    Steps Mild Impairment    Total Score 20      High Level Balance   High Level Balance Comments mCTSIB able to hold all 4 positions                           [facility] Adult PT Treatment/Exercise - 03/04/21 0001       Ambulation/Gait   Ambulation/Gait Yes    Ambulation/Gait  Assistance 6: Modified independent (Device/Increase time)    Ambulation Distance (Feet) 200 Feet    Gait Pattern Step-through pattern;Decreased step length - right;Decreased step length - left    Ambulation Surface Level;Indoor    Stairs Yes    Stairs Assistance 6: Modified independent (Device/Increase time)    Stair Management Technique One rail Left    Number of Stairs 4    Height of Stairs 6    Gait Comments arm in arm asist                 Balance Exercises - 03/04/21 0001       Balance Exercises: Standing   Standing Eyes Closed Wide (BOA);Head turns;Foam/compliant surface;5 reps;30 secs;Limitations    Standing Eyes Closed Limitations no LOB observed    Retro Gait Foam/compliant surface;5 reps;Limitations    Retro Gait Limitations forward and backward walking in // bars, 5 trips across foam beginning with B UE support weaning to no UE support including retrieving objects from ground level                  PT Short Term Goals - 02/21/21 1324       PT SHORT TERM GOAL #1   Title Patient to demo I in initial  [place]; 02/21/21 Able to demo HEP w/o cues    Time 4    Period Weeks    Status Achieved    Target Date 02/28/21      PT SHORT TERM GOAL #2   Title Assess FGA and set appropriate goal    Baseline 02/09/21 PT attempted FGA but due to blindness and needing guiding for direction not a good assessment for patient.    Time 4    Period Weeks    Status Deferred    Target Date 02/28/21      PT SHORT TERM GOAL #3   Title Improve 5x STS test score to <13s    Baseline 16.9s; 02/21/21 12.4s    Time 4    Period Weeks    Status Achieved    Target Date 02/28/21               PT Long Term Goals - 03/04/21 1103       PT LONG TERM GOAL #1   Title Patient to negotiate 16 steps with single rail and step through pattern    Baseline 4 steps with step to pattern and B rails; 03/04/21 Able to negotiate stairs with single ail and step through pattern    Time 8    Period Weeks    Status Achieved      PT LONG TERM GOAL #2   Title Assess progress towards FGA goal    Baseline TBD; 03/04/21 DGI score 20/24, tst modified to address vision loss    Time 8    Period Weeks    Status Achieved      PT LONG TERM GOAL #3   Title Patient to ambulate across outdoor surfaces with S    Baseline In clinic ambulation arm-in-arm with therapist; 03/04/21 patient able to ambulate outdoors using blind cane across level and sloped ground w/o LOB assessed on previous visit    Time 8    Period Weeks    Status Achieved                   Plan - 03/04/21 1136     Clinical Impression Statement Todays session asessed remaining unmet goals  and funcitonal tasks to determine balance deficits acrosss varied environments.  Patient scored a 20/24 on DGI modified for vision loss, decreased her 5x STS time, negotiated stairs with a single rail and negotiated unlevel ground w/o UE support including reaching to retrieve objects from ground levels.  All goals met, no deficits oustanding, patient ready for  DC    Personal Factors and Comorbidities Comorbidity 1    Comorbidities legally blind    Examination-Activity Limitations Locomotion Level    Stability/Clinical Decision Making Stable/Uncomplicated    Rehab Potential Good    PT Frequency 1x / week    PT Duration 8 weeks    PT Treatment/Interventions ADLs/Self Care Home Management;Aquatic Therapy;DME Instruction;Gait training;Stair training;Functional mobility training;Therapeutic activities;Therapeutic exercise;Balance training;Neuromuscular re-education;Patient/family education    PT Next Visit [place] and Agree with Plan of Care Patient             Patient will benefit from skilled therapeutic intervention in order to improve the following deficits and impairments:  Abnormal gait, Difficulty walking, Decreased endurance, Decreased safety awareness, Decreased activity tolerance, Decreased balance, Decreased strength  Visit Diagnosis: Other abnormalities of gait and mobility  Unsteadiness on feet  Balance disorder     Problem List Patient Active Problem List   Diagnosis Date Noted   Seizure ([place]) 08/18/2020   Hyponatremia 08/18/2020   Hyperthyroidism 08/18/2020    Lanice Shirts, PT 03/04/2021, 11:44 AM  [facility] [address] [place], [place], [address] Phone: [phone]   Fax:  [phone]  Name: Janet Dennis MRN: [id number] Date of Birth: 11-Apr-1981

## 2021-03-07 ENCOUNTER — Ambulatory Visit: Payer: BC Managed Care – PPO

## 2021-03-07 ENCOUNTER — Ambulatory Visit: Payer: BC Managed Care – PPO | Admitting: Endocrinology

## 2021-03-09 ENCOUNTER — Other Ambulatory Visit: Payer: Self-pay | Admitting: Endocrinology

## 2021-03-09 ENCOUNTER — Ambulatory Visit: Payer: BC Managed Care – PPO

## 2021-03-10 ENCOUNTER — Other Ambulatory Visit: Payer: Self-pay | Admitting: Endocrinology

## 2021-03-14 ENCOUNTER — Ambulatory Visit: Payer: BC Managed Care – PPO

## 2021-03-21 ENCOUNTER — Ambulatory Visit: Payer: BC Managed Care – PPO

## 2021-03-23 ENCOUNTER — Ambulatory Visit (INDEPENDENT_AMBULATORY_CARE_PROVIDER_SITE_OTHER): Payer: BC Managed Care – PPO | Admitting: Endocrinology

## 2021-03-23 ENCOUNTER — Other Ambulatory Visit: Payer: Self-pay

## 2021-03-23 VITALS — BP 120/70 | HR 83 | Ht 61.0 in | Wt 135.4 lb

## 2021-03-23 DIAGNOSIS — E0590 Thyrotoxicosis, unspecified without thyrotoxic crisis or storm: Secondary | ICD-10-CM

## 2021-03-23 LAB — TSH: TSH: 1.91 u[IU]/mL (ref 0.35–5.50)

## 2021-03-23 LAB — T4, FREE: Free T4: 0.79 ng/dL (ref 0.60–1.60)

## 2021-03-23 NOTE — Progress Notes (Signed)
   Subjective:    Patient ID: Janet Dennis, female    DOB: 1980/09/02, 40 y.o.   MRN: [id number]  HPI Pt returns for f/u of hyperthyroidism (dx'ed early 2022, when she pres with sz; she was rx'ed tapazole and b-blocker; she has never had thyroid imaging; in 8/22, tapazole was stopped for high TSH).   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   Premature baby    Seizures ([facility]) 08/2020    Past Surgical History:  Procedure Laterality Date   PATENT DUCTUS ARTERIOUS REPAIR     as infant   SCAR REVISION     WISDOM TOOTH EXTRACTION      Social History   Socioeconomic History   Marital status: Single    Spouse name: Not on file   Number of children: 0   Years of education: Not on file   Highest education level: Associate degree: academic program  Occupational History   Not on file  Tobacco Use   Smoking status: Never   Smokeless tobacco: Never  Vaping Use   Vaping Use: Never used  Substance and Sexual Activity   Alcohol use: Yes    Comment: occ   Drug use: Never   Sexual activity: Never    Birth control/protection: None  Other Topics Concern   Not on file  Social History Narrative   Lives with family   Social Determinants of Health   Financial Resource Strain: Not on file  Food Insecurity: Not on file  Transportation Needs: Not on file  Physical Activity: Not on file  Stress: Not on file  Social Connections: Not on file  Intimate Partner Violence: Not on file    Current Outpatient Medications on File Prior to Visit  Medication Sig Dispense Refill   levETIRAcetam (KEPPRA) 500 MG tablet Take 1 tablet (500 mg total) by mouth 2 (two) times daily. 180 tablet 4   neomycin-polymyxin b-dexamethasone (MAXITROL) 3.5-10000-0.1 OINT Place 1 application into both eyes in the morning and at bedtime.     No current facility-administered medications on file prior to visit.    No Known  Allergies  Family History  Problem Relation Age of Onset   Thyroid disease Father     BP 120/70 (BP Location: Right Arm, Patient Position: Sitting, Cuff Size: Normal)   Pulse 83   Ht 5\' 1"  (1.549 m)   Wt 135 lb 6.4 oz (61.4 kg)   SpO2 94%   BMI 25.58 kg/m   Review of Systems Denies fever.     Objective:   Physical Exam VITAL SIGNS:  See vs page GENERAL: no distress NECK: There is no palpable thyroid enlargement.  No thyroid nodule is palpable.  No palpable lymphadenopathy at the anterior neck.     Lab Results  Component Value Date   TSH 1.91 03/23/2021      Assessment & Plan:  Hyperthyroidism: still in remission.  I advised pt we should continue to hold off on tapazole.

## 2021-03-23 NOTE — Patient Instructions (Addendum)
Blood tests are requested for you today.  We'll let you know about the results.  Please come back for a follow-up appointment in 3 months.   

## 2021-06-29 ENCOUNTER — Ambulatory Visit: Payer: BC Managed Care – PPO | Admitting: Endocrinology

## 2021-07-04 ENCOUNTER — Ambulatory Visit (INDEPENDENT_AMBULATORY_CARE_PROVIDER_SITE_OTHER): Payer: BC Managed Care – PPO | Admitting: Endocrinology

## 2021-07-04 ENCOUNTER — Other Ambulatory Visit: Payer: Self-pay

## 2021-07-04 VITALS — BP 118/70 | HR 71 | Ht 61.0 in | Wt 124.0 lb

## 2021-07-04 DIAGNOSIS — E0590 Thyrotoxicosis, unspecified without thyrotoxic crisis or storm: Secondary | ICD-10-CM | POA: Diagnosis not present

## 2021-07-04 LAB — TSH: TSH: <0.01 u[IU]/mL — ABNORMAL LOW (ref 0.35–5.50)

## 2021-07-04 LAB — T4, FREE: Free T4: 1.72 ng/dL — ABNORMAL HIGH (ref 0.60–1.60)

## 2021-07-04 MED ORDER — METHIMAZOLE 10 MG PO TABS: 10.0000 mg | ORAL_TABLET | Freq: Every day | ORAL | 0 refills | Status: DC

## 2021-07-04 NOTE — Progress Notes (Signed)
° °  Subjective:    Patient ID: Janet Dennis, female    DOB: [birth year], 41 y.o.   MRN: [id number]  HPI Pt returns for f/u of hyperthyroidism (dx'ed early 2022, when she pres with sz; she was rx'ed tapazole and b-blocker; she has never had thyroid imaging; in 8/22, tapazole was stopped for high TSH).  pt states she feels well in general.  Specifically, she denies palpitations and tremor.   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   Premature baby    Seizures ([facility]) 08/2020    Past Surgical History:  Procedure Laterality Date   PATENT DUCTUS ARTERIOUS REPAIR     as infant   SCAR REVISION     WISDOM TOOTH EXTRACTION      Social History   Socioeconomic History   Marital status: Single    Spouse name: Not on file   Number of children: 0   Years of education: Not on file   Highest education level: Associate degree: academic program  Occupational History   Not on file  Tobacco Use   Smoking status: Never   Smokeless tobacco: Never  Vaping Use   Vaping Use: Never used  Substance and Sexual Activity   Alcohol use: Yes    Comment: occ   Drug use: Never   Sexual activity: Never    Birth control/protection: None  Other Topics Concern   Not on file  Social History Narrative   Lives with family   Social Determinants of Health   Financial Resource Strain: Not on file  Food Insecurity: Not on file  Transportation Needs: Not on file  Physical Activity: Not on file  Stress: Not on file  Social Connections: Not on file  Intimate Partner Violence: Not on file    Current Outpatient Medications on File Prior to Visit  Medication Sig Dispense Refill   levETIRAcetam (KEPPRA) 500 MG tablet Take 1 tablet (500 mg total) by mouth 2 (two) times daily. 180 tablet 4   neomycin-polymyxin b-dexamethasone (MAXITROL) 3.5-10000-0.1 OINT Place 1 application into both eyes in the morning and at bedtime.     No  current facility-administered medications on file prior to visit.    No Known Allergies  Family History  Problem Relation Age of Onset   Thyroid disease Father     BP 118/70    Pulse 71    Ht 5\' 1"  (1.549 m)    Wt 124 lb (56.2 kg)    SpO2 99%    BMI 23.43 kg/m    Review of Systems Denies fever    Objective:   Physical Exam VITAL SIGNS:  See vs page GENERAL: no distress NECK: There is no palpable thyroid enlargement.  No thyroid nodule is palpable.  No palpable lymphadenopathy at the anterior neck.     Lab Results  Component Value Date   TSH <0.01 Repeated and verified X2. (L) 07/04/2021      Assessment & Plan:  Hyperthyroidism: uncontrolled again.  I have sent a prescription to your pharmacy, to resume the methimazole.

## 2021-07-04 NOTE — Patient Instructions (Addendum)
Blood tests are requested for you today.  We'll let you know about the results.    If the blood is normal today, I would be happy to see you back here as needed, and you should have the blood test checked twice a year with Dr Luciana Axe.

## 2021-07-06 ENCOUNTER — Encounter: Payer: Self-pay | Admitting: Diagnostic Neuroimaging

## 2021-07-06 ENCOUNTER — Ambulatory Visit (INDEPENDENT_AMBULATORY_CARE_PROVIDER_SITE_OTHER): Payer: BC Managed Care – PPO | Admitting: Diagnostic Neuroimaging

## 2021-07-06 ENCOUNTER — Other Ambulatory Visit: Payer: Self-pay

## 2021-07-06 VITALS — BP 122/82 | HR 87 | Ht 61.0 in | Wt 130.0 lb

## 2021-07-06 DIAGNOSIS — R569 Unspecified convulsions: Secondary | ICD-10-CM

## 2021-07-06 MED ORDER — LEVETIRACETAM 500 MG PO TABS: 500.0000 mg | ORAL_TABLET | Freq: Two times a day (BID) | ORAL | 4 refills | Status: DC

## 2021-07-06 NOTE — Progress Notes (Signed)
[facility]  PATIENT: Janet Dennis DOB: [birth year]  REFERRING CLINICIAN: Verlon Au, MD HISTORY FROM: patient  REASON FOR VISIT: follow up   HISTORICAL  CHIEF COMPLAINT:  Chief Complaint  Patient presents with   Follow-up    Rm 6 alone here for 8 month f/u on seizures- pt reports she has been doing well denies any seizures activity.     HISTORY OF PRESENT ILLNESS:   UPDATE (07/06/21, VRP): Since last visit, doing well. Symptoms are stable. No seizures. No alleviating or aggravating factors. Tolerating LEV 500mg  twice a day.    UPDATE (11/03/20, VRP): 41 year old female with history of premature birth and blindness, here for evaluation of seizure.  Patient was the hospital in March 2022 for new onset seizure.  Due to history of premature birth, intracerebral hemorrhage, abnormal MRI of the brain she was started on antiseizure medication after this new onset seizure.  Since that time patient doing well.  No side effects of medication.  PRIOR HPI (08/18/20, Dr. 08/20/20): "[name] is a 41 y.o. 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 Patient's mother witnessed much of the episode.  Patient reports that she just got out of the shower (was sitting in the bed that the last thing that she remembers.  Patient wants report that she heard the patient get on the phone and noted that she was sitting at the edge of the bed with her head down.  She called out her name with no response, asked if she was doing okay with no response.  She noted that patient's hands rose up and was very stiff and she made a weird noise with some foaming at her mouth.  She immediately got the patient and helped her down to the ground and lay her on her side and by that time the event was over and patient appeared to be in deep sleep.  She immediately called EMS who arrived with a couple minutes  and noted the patient was somnolent and not following any commands.  She was brought into the ED and was waking up more in the ED.  The entirety of the event lasted about somewhere between 1 to 2 minutes.  Patient has no recollection of the event.   Patient denies any recent signs or symptoms of an upper respiratory infection or UTI or gastroenteritis.  Endorses unintentional weight loss with hot flashes over the last several weeks along with sweating.  Denies any prior history of CNS infection, no history of strokes, no significant head injury with loss of consciousness, denies any recent sleep deprivation, no significant alcohol intake, does not use any recreational drugs.   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   ALLERGIES: No Known Allergies  HOME MEDICATIONS: Outpatient Medications Prior to Visit  Medication Sig Dispense Refill   levETIRAcetam (KEPPRA) 500 MG tablet Take 1 tablet (500 mg total) by mouth 2 (two) times daily. 180 tablet 4   methimazole (TAPAZOLE) 10 MG tablet Take 1 tablet (10 mg total) by mouth daily. (Patient not taking: Reported on 07/06/2021) 90 tablet 0   neomycin-polymyxin  b-dexamethasone (MAXITROL) 3.5-10000-0.1 OINT Place 1 application into both eyes in the morning and at bedtime.     No facility-administered medications prior to visit.    PAST MEDICAL HISTORY: 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   Premature baby    Seizures ([facility]) 08/2020    PAST SURGICAL HISTORY: Past Surgical History:  Procedure Laterality Date    PATENT DUCTUS ARTERIOUS REPAIR     as infant   SCAR REVISION     WISDOM TOOTH EXTRACTION      FAMILY HISTORY: Family History  Problem Relation Age of Onset   Thyroid disease Father     SOCIAL HISTORY: Social History   Socioeconomic History   Marital status: Single    Spouse name: Not on file   Number of children: 0   Years of education: Not on file   Highest education level: Associate degree: academic program  Occupational History   Not on file  Tobacco Use   Smoking status: Never   Smokeless tobacco: Never  Vaping Use   Vaping Use: Never used  Substance and Sexual Activity   Alcohol use: Yes    Comment: occ   Drug use: Never   Sexual activity: Never    Birth control/protection: None  Other Topics Concern   Not on file  Social History Narrative   Lives with family   Social Determinants of Health   Financial Resource Strain: Not on file  Food Insecurity: Not on file  Transportation Needs: Not on file  Physical Activity: Not on file  Stress: Not on file  Social Connections: Not on file  Intimate Partner Violence: Not on file     PHYSICAL EXAM  GENERAL EXAM/CONSTITUTIONAL: Vitals:  Vitals:   07/06/21 1046  BP: 122/82  Pulse: 87  SpO2: 99%  Weight: 130 lb (59 kg)  Height: 5\' 1"  (1.549 m)   Body mass index is 24.56 kg/m. Wt Readings from Last 3 Encounters:  07/06/21 130 lb (59 kg)  07/04/21 124 lb (56.2 kg)  03/23/21 135 lb 6.4 oz (61.4 kg)   Patient is in no distress; well developed, nourished and groomed; neck is supple  CARDIOVASCULAR: Examination of carotid arteries is normal; no carotid bruits Regular rate and rhythm, no murmurs Examination of peripheral vascular system by observation and palpation is normal  EYES: Ophthalmoscopic exam of optic discs and posterior segments is normal; no papilledema or hemorrhages No results found.  MUSCULOSKELETAL: Gait, strength, tone, movements noted in Neurologic exam below  NEUROLOGIC: MENTAL  STATUS:  No flowsheet data found. awake, alert, oriented to person, place and time recent and remote memory intact normal attention and concentration language fluent, comprehension intact, naming intact fund of knowledge appropriate  CRANIAL NERVE:  2nd - SCAR TISSUE OVER BILATERAL CORNEA 2nd, 3rd, 4th, 6th - BLIND; PUPILS NOT VISUALIZED 5th - facial sensation symmetric 7th - facial strength symmetric 8th - hearing intact 9th - palate elevates symmetrically, uvula midline 11th - shoulder shrug symmetric 12th - tongue protrusion midline  MOTOR:  normal bulk and tone, full strength in the BUE, BLE  SENSORY:  normal and symmetric to light touch, temperature, vibration  COORDINATION:  finger-nose-finger, fine finger movements normal  REFLEXES:  deep tendon reflexes TRACE and symmetric  GAIT/STATION:  narrow based gait     DIAGNOSTIC DATA (LABS, IMAGING, TESTING) - I reviewed patient records, labs, notes, testing and imaging  myself where available.  Lab Results  Component Value Date   WBC 6.4 08/18/2020   HGB 10.5 (L) 08/18/2020   HCT 32.1 (L) 08/18/2020   MCV 77.3 (L) 08/18/2020   PLT 286 08/18/2020      Component Value Date/Time   NA 142 08/18/2020 1258   K 3.8 08/18/2020 1258   CL 113 (H) 08/18/2020 1258   CO2 23 08/18/2020 1258   GLUCOSE 139 (H) 08/18/2020 1258   BUN 7 08/18/2020 1258   CREATININE 0.53 08/18/2020 1258   CALCIUM 9.1 08/18/2020 1258   PROT 5.9 (L) 08/18/2020 0436   ALBUMIN 2.8 (L) 08/18/2020 0436   AST 26 08/18/2020 0436   ALT 29 08/18/2020 0436   ALKPHOS 76 08/18/2020 0436   BILITOT 1.5 (H) 08/18/2020 0436   GFRNONAA >60 08/18/2020 1258   No results found for: CHOL, HDL, LDLCALC, LDLDIRECT, TRIG, CHOLHDL No results found for: ZOXW9UHGBA1C No results found for: VITAMINB12 Lab Results  Component Value Date   TSH <0.01 Repeated and verified X2. (L) 07/04/2021    08/18/20 MRI brain 1. Question asymmetric atrophy of the right hippocampal  formation with subtle loss of the normal internal hippocampal architecture and increased FLAIR signal abnormality. 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 A focal cortical dysplasia would be the primary differential consideration. Correlation with EEG recommended. 2. 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 3. No other acute intracranial abnormality.    ASSESSMENT AND PLAN  41 y.o. year old female here with:   Dx:  1. Seizure ([facility])      PLAN:  NEW ONSET SEIZURE (h/o prematurity, intracerebral hemorrhage, mesial temporal sclerosis; also new diagnosis of hyperthyroidism) - continue levetiracetam 500mg  twice a day - try to setup virtual visit vs telephone visit in future  Meds ordered this encounter  Medications   levETIRAcetam (KEPPRA) 500 MG tablet    Sig: Take 1 tablet (500 mg total) by mouth 2 (two) times daily.    Dispense:  180 tablet    Refill:  4   Return in about 1 year (around 07/06/2022) for MyChart visit (15 min).       Suanne MarkerVIKRAM R. Fabiola Mudgett, MD 07/06/2021, 11:22 AM Certified in Neurology, Neurophysiology and Neuroimaging  [facility] [address], [address] [place], [place] [id number] [phone]

## 2021-09-26 ENCOUNTER — Other Ambulatory Visit: Payer: Self-pay | Admitting: Endocrinology

## 2021-09-27 ENCOUNTER — Ambulatory Visit: Payer: BC Managed Care – PPO | Admitting: Endocrinology

## 2022-02-17 IMAGING — CT CT ANGIO CHEST
2 of 6 series · 19 of 36 positions shown · IV contrast (omnipaque)
Comparison: 08/17/2020

CLINICAL DATA: Convulsions

EXAM:
CT ANGIOGRAPHY CHEST WITH CONTRAST
TECHNIQUE: Multidetector CT imaging of the chest was performed using the
standard protocol during bolus administration of intravenous
contrast. Multiplanar CT image reconstructions and MIPs were
obtained to evaluate the vascular anatomy.
CONTRAST:  50mL OMNIPAQUE IOHEXOL 350 MG/ML SOLN

[Series 7: pe thins · axial · 0.62mm/px · z∈[+1155,+1379]mm · 18 of 355 slices shown]
[im 18/355  lung]
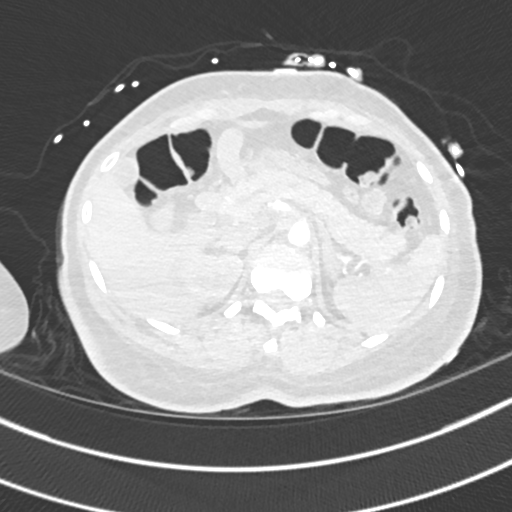
[im 36/355  mediastinal]
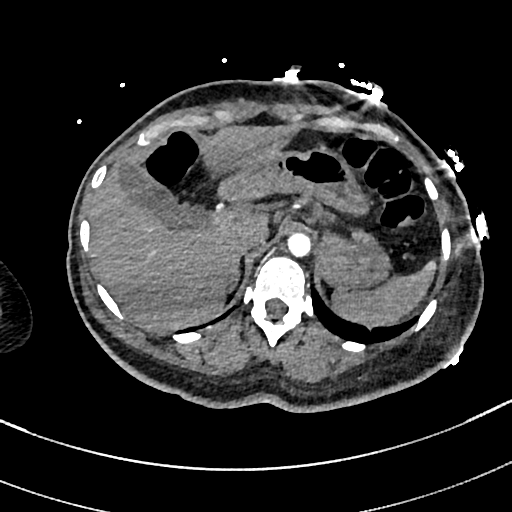
[im 54/355  lung]
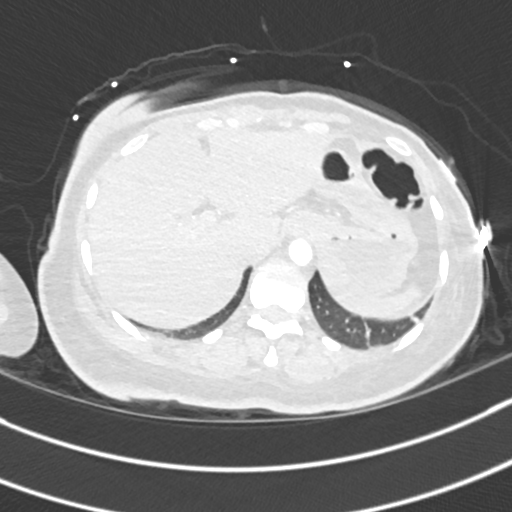
[im 71/355  mediastinal]
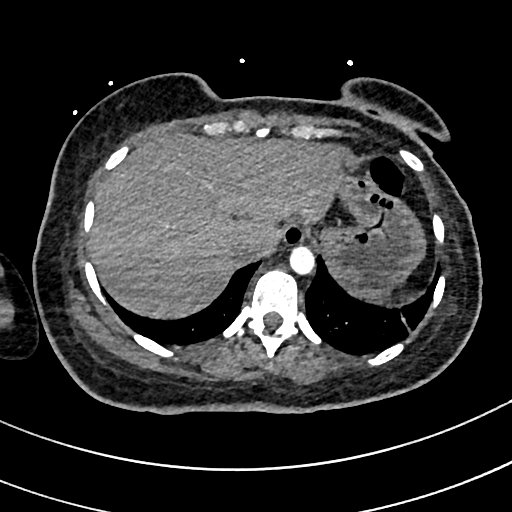
[im 89/355  lung]
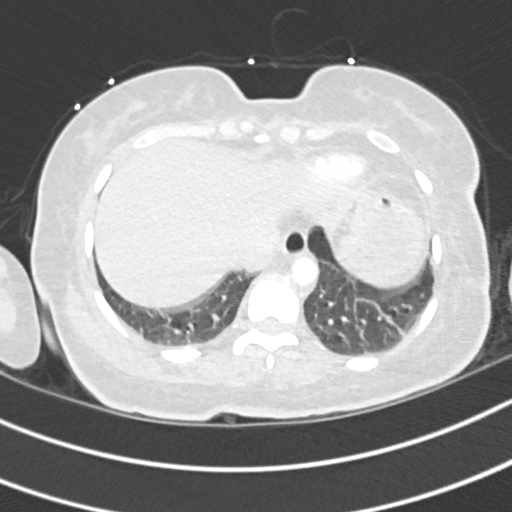
[im 107/355  mediastinal]
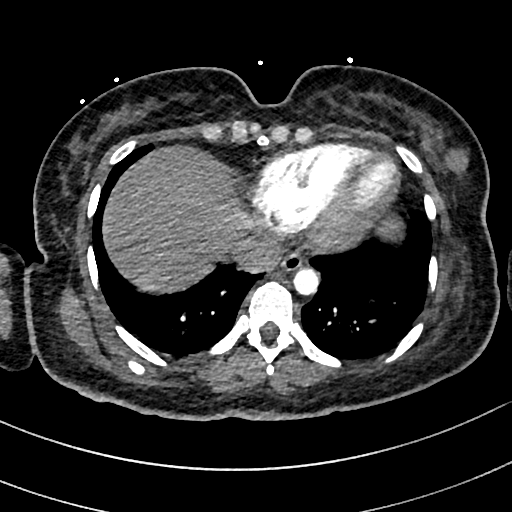
[im 124/355  lung]
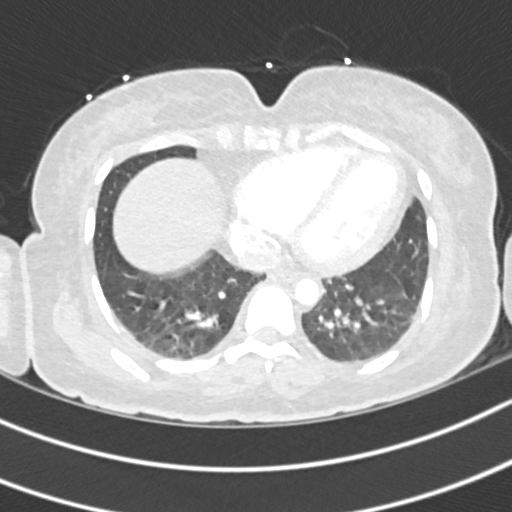
[im 142/355  mediastinal]
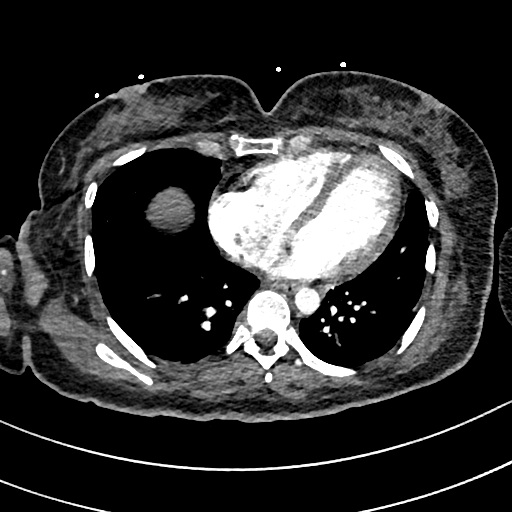
[im 160/355  lung]
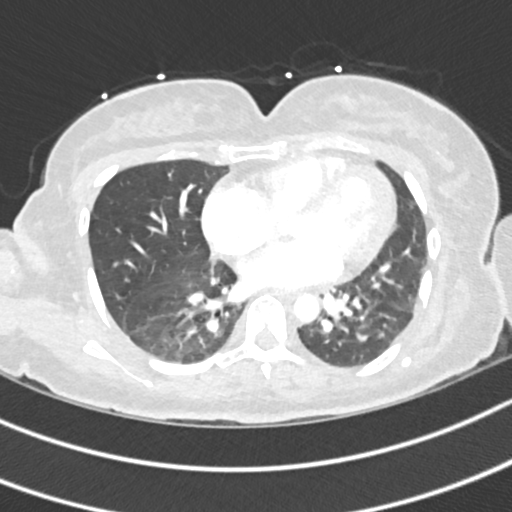
[im 195/355  mediastinal]
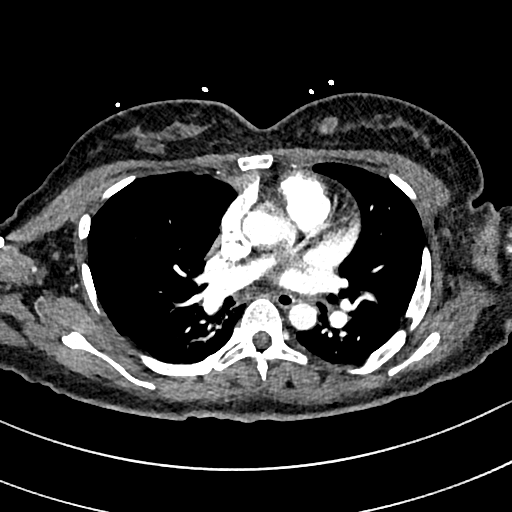
[im 213/355  lung]
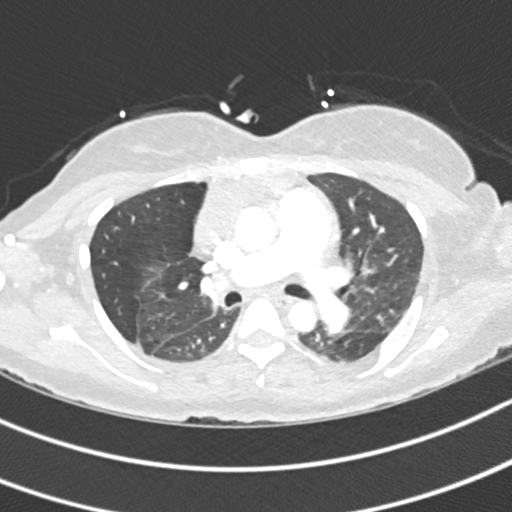
[im 231/355  mediastinal]
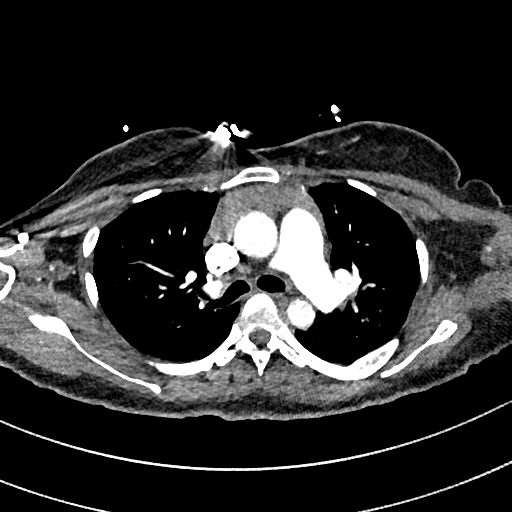
[im 248/355  lung]
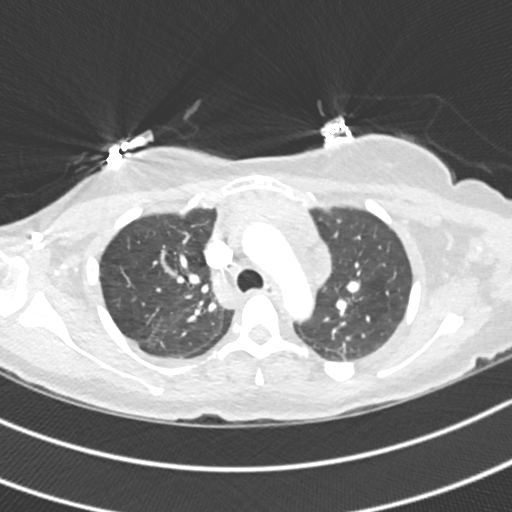
[im 266/355  mediastinal]
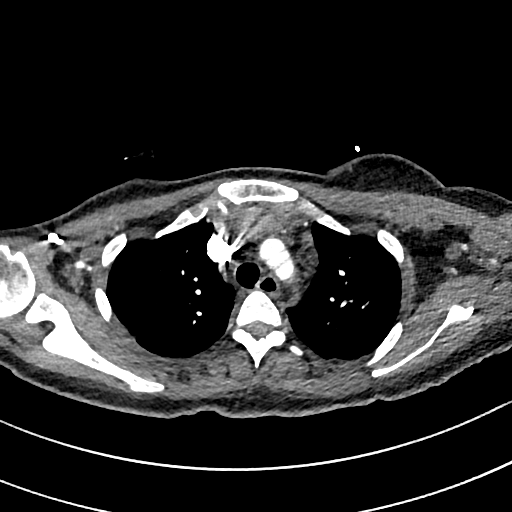
[im 284/355  lung]
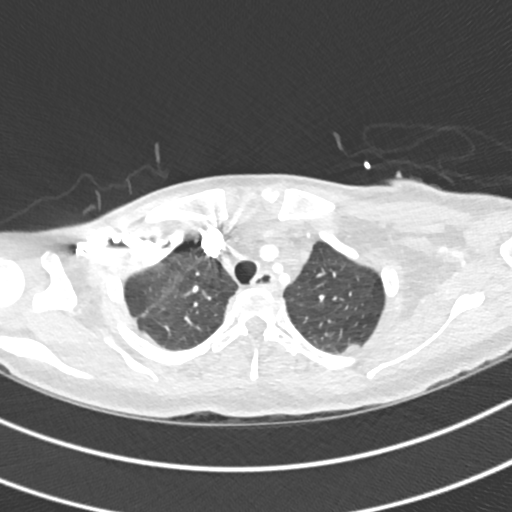
[im 301/355  mediastinal]
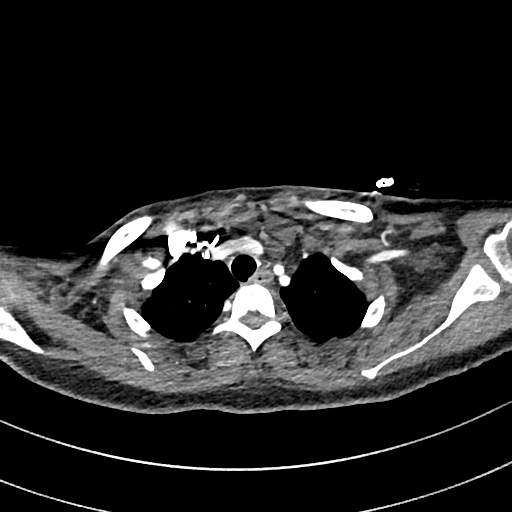
[im 319/355  lung]
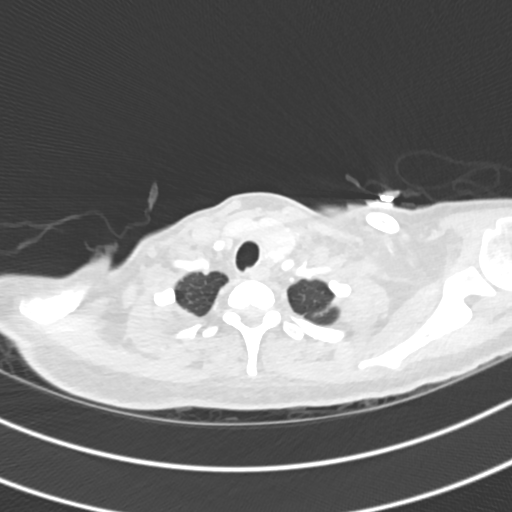
[im 337/355  mediastinal]
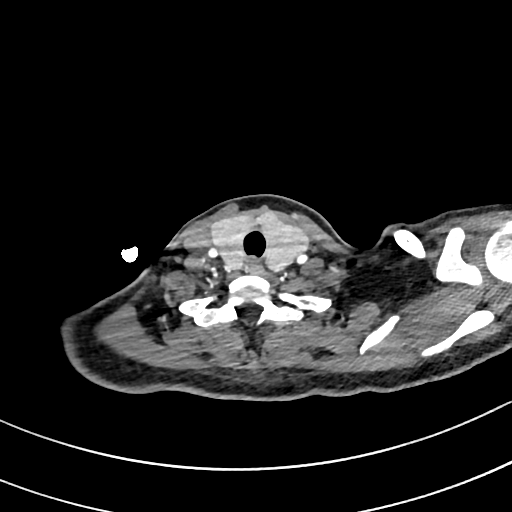

[Series 8: pe 2mm cor · coronal · 0.49mm/px · 1 of 120 slices shown]
[im 60/120  mediastinal]
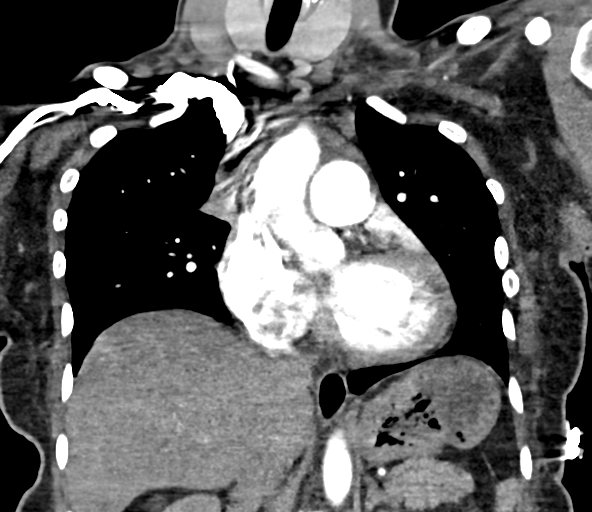

[19 of 36 positions shown; findings below may reference images not displayed]

FINDINGS: Cardiovascular: This is a technically adequate evaluation of the
pulmonary vasculature. No filling defects or pulmonary emboli.

The heart is unremarkable without pericardial effusion. Normal
caliber of the thoracic aorta without aneurysm or dissection.

Mediastinum/Nodes: Prominent soft tissue within the anterior
mediastinum compatible with thymus. No pathologic adenopathy.
Thyroid, trachea, and esophagus are unremarkable.

Lungs/Pleura: Scattered areas of scarring are seen at the lung bases
and within the right upper lobe. No acute airspace disease,
effusion, or pneumothorax. Central airways are patent.

Upper Abdomen: No acute abnormality.

Musculoskeletal: No acute or destructive bony lesions. Reconstructed
images demonstrate no additional findings.

Review of the MIP images confirms the above findings.
IMPRESSION: 1. No evidence of pulmonary embolus.
2. Soft tissue within the anterior mediastinum, most consistent with
prominent thymus. This is abnormal in a patient of this age, and
could reflect hyperplasia or thymic rebound.
3. Otherwise no acute intrathoracic process.

## 2022-07-10 ENCOUNTER — Encounter: Payer: Self-pay | Admitting: Diagnostic Neuroimaging

## 2022-07-10 ENCOUNTER — Telehealth: Payer: Medicare Other | Admitting: Diagnostic Neuroimaging

## 2022-07-10 DIAGNOSIS — R569 Unspecified convulsions: Secondary | ICD-10-CM | POA: Diagnosis not present

## 2022-07-10 MED ORDER — LEVETIRACETAM 500 MG PO TABS: 500.0000 mg | ORAL_TABLET | Freq: Two times a day (BID) | ORAL | 4 refills | Status: DC

## 2022-07-10 NOTE — Progress Notes (Signed)
[facility]  PATIENT: Janet Dennis DOB: Jun 07, 1980  REFERRING CLINICIAN: Bartholome Bill, MD HISTORY FROM: patient  REASON FOR VISIT: follow up   HISTORICAL  CHIEF COMPLAINT:  Chief Complaint  Patient presents with   Seizures    HISTORY OF PRESENT ILLNESS:   UPDATE (07/10/22, VRP): Since last visit, doing well. No seizures. Tolerating LEV.    UPDATE (07/06/21, VRP): Since last visit, doing well. Symptoms are stable. No seizures. No alleviating or aggravating factors. Tolerating LEV 500mg  twice a day.    UPDATE (11/03/20, VRP): 42 year old female with history of premature birth and blindness, here for evaluation of seizure.  Patient was the hospital in March 2022 for new onset seizure.  Due to history of premature birth, intracerebral hemorrhage, abnormal MRI of the brain she was started on antiseizure medication after this new onset seizure.  Since that time patient doing well.  No side effects of medication.  PRIOR HPI (08/18/20, Dr. Lorrin Goodell): "Janet Dennis is a 42 y.o. 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 Patient's mother witnessed much of the episode.  Patient reports that she just got out of the shower (was sitting in the bed that the last thing that she remembers.  Patient wants report that she heard the patient get on the phone and noted that she was sitting at the edge of the bed with her head down.  She called out her name with no response, asked if she was doing okay with no response.  She noted that patient's hands rose up and was very stiff and she made a weird noise with some foaming at her mouth.  She immediately got the patient and helped her down to the ground and lay her on her side and by that time the event was over and patient appeared to be in deep sleep.  She immediately called EMS who arrived with a couple minutes and noted the patient was  somnolent and not following any commands.  She was brought into the ED and was waking up more in the ED.  The entirety of the event lasted about somewhere between 1 to 2 minutes.  Patient has no recollection of the event.   Patient denies any recent signs or symptoms of an upper respiratory infection or UTI or gastroenteritis.  Endorses unintentional weight loss with hot flashes over the last several weeks along with sweating.  Denies any prior history of CNS infection, no history of strokes, no significant head injury with loss of consciousness, denies any recent sleep deprivation, no significant alcohol intake, does not use any recreational drugs.   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   ALLERGIES: No Known Allergies  HOME MEDICATIONS: Outpatient Medications Prior to Visit  Medication Sig Dispense Refill   methimazole (TAPAZOLE) 10 MG tablet Take 1 tablet by mouth once daily 90 tablet 0   levETIRAcetam (KEPPRA) 500 MG tablet Take 1 tablet (500 mg total) by mouth 2 (two) times daily. 180 tablet 4   No facility-administered medications prior to visit.    PAST MEDICAL HISTORY: Past Medical History:  Diagnosis Date  Bilateral hearing loss    Blind 1982   Brain bleed ([place])    After birth   Premature baby    Seizures ([place]) 08/2020    PAST SURGICAL HISTORY: Past Surgical History:  Procedure Laterality Date   PATENT DUCTUS ARTERIOUS REPAIR     as infant   SCAR REVISION     WISDOM TOOTH EXTRACTION      FAMILY HISTORY: Family History  Problem Relation Age of Onset   Thyroid disease Father     SOCIAL  HISTORY: Social History   Socioeconomic History   Marital status: Single    Spouse name: Not on file   Number of children: 0   Years of education: Not on file   Highest education level: Associate degree: academic program  Occupational History   Not on file  Tobacco Use   Smoking status: Never   Smokeless tobacco: Never  Vaping Use   Vaping Use: Never used  Substance and Sexual Activity   Alcohol use: Yes    Comment: occ   Drug use: Never   Sexual activity: Never    Birth control/protection: None  Other Topics Concern   Not on file  Social History Narrative   Lives with family   Social Determinants of Health   Financial Resource Strain: Not on file  Food Insecurity: Not on file  Transportation Needs: Not on file  Physical Activity: Not on file  Stress: Not on file  Social Connections: Not on file  Intimate Partner Violence: Not on file     PHYSICAL EXAM  Video visit     DIAGNOSTIC DATA (LABS, IMAGING, TESTING) - I reviewed patient records, labs, notes, testing and imaging myself where available.  Lab Results  Component Value Date   WBC 6.4 08/18/2020   HGB 10.5 (L) 08/18/2020   HCT 32.1 (L) 08/18/2020   MCV 77.3 (L) 08/18/2020   PLT 286 08/18/2020      Component Value Date/Time   NA 142 08/18/2020 1258   K 3.8 08/18/2020 1258   CL 113 (H) 08/18/2020 1258   CO2 23 08/18/2020 1258   GLUCOSE 139 (H) 08/18/2020 1258   BUN 7 08/18/2020 1258   CREATININE 0.53 08/18/2020 1258   CALCIUM 9.1 08/18/2020 1258   PROT 5.9 (L) 08/18/2020 0436   ALBUMIN 2.8 (L) 08/18/2020 0436   AST 26 08/18/2020 0436   ALT 29 08/18/2020 0436   ALKPHOS 76 08/18/2020 0436   BILITOT 1.5 (H) 08/18/2020 0436   GFRNONAA >60 08/18/2020 1258   No results found for: "CHOL", "HDL", "LDLCALC", "LDLDIRECT", "TRIG", "CHOLHDL" No results found for: "HGBA1C" No results found for: "VITAMINB12" Lab Results  Component Value Date   TSH <0.01 Repeated and verified X2. (L) 07/04/2021     08/18/20 MRI brain 1. 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 A focal cortical dysplasia would be the primary differential consideration. Correlation with EEG recommended. 2. 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 3. No other acute intracranial abnormality.    ASSESSMENT AND PLAN  42 y.o. year old female here with:   Dx:  1. Seizure ([place])     PLAN:  NEW ONSET SEIZURE (h/o prematurity, intracerebral hemorrhage, mesial temporal sclerosis; also new diagnosis of hyperthyroidism) - continue levetiracetam  500mg  twice a day  Meds ordered this encounter  Medications   levETIRAcetam (KEPPRA) 500 MG tablet    Sig: Take 1 tablet (500 mg total) by mouth 2 (two) times daily.    Dispense:  180 tablet    Refill:  4   Return in about 1 year (around 07/11/2023) for MyChart visit (15 min).  Virtual Visit via Video Note  I connected with Janet Dennis on 07/10/22 at  1:45 PM EST by a video enabled telemedicine application and verified that I am speaking with the correct person using two identifiers.   I discussed the limitations of evaluation and management by telemedicine and the availability of in person appointments. The patient expressed understanding and agreed to proceed.  Patient is at home and I am at the office.   I spent 15 minutes of face-to-face and non-face-to-face time with patient.  This included previsit chart review, lab review, study review, order entry, electronic health record documentation, patient education.        Penni Bombard, MD 09/12/4494, 7:59 PM Certified in Neurology, Neurophysiology and  Neuroimaging  [facility] [address] [place], [place] [address] [phone]

## 2023-10-15 ENCOUNTER — Other Ambulatory Visit: Payer: Self-pay | Admitting: Diagnostic Neuroimaging

## 2023-10-18 ENCOUNTER — Other Ambulatory Visit: Payer: Self-pay | Admitting: Diagnostic Neuroimaging

## 2023-12-31 ENCOUNTER — Telehealth: Payer: Self-pay | Admitting: Diagnostic Neuroimaging

## 2023-12-31 NOTE — Telephone Encounter (Signed)
 Pt called to see if she can get refill of medication  levETIRAcetam  (KEPPRA ) 500 MG tablet  Pt states that she wish to have a video visit if she has to  schedule appt the patient is not  sure if she needs that  appt but Pt  wanted to message MD  to see if she can have a video visit in order to get her medication

## 2024-01-02 ENCOUNTER — Other Ambulatory Visit: Payer: Self-pay | Admitting: Diagnostic Neuroimaging

## 2024-01-02 NOTE — Telephone Encounter (Signed)
 Apt scheduled.

## 2024-01-02 NOTE — Telephone Encounter (Signed)
 Patient schedule office visit appointment. Would not allow me to schedule appointment. Sent message to nurse schedule due to system would not allow me to override to on hold appt on 01/07/24 at 11:115 am.  Could you schedule the appointment?

## 2024-01-07 ENCOUNTER — Telehealth (INDEPENDENT_AMBULATORY_CARE_PROVIDER_SITE_OTHER): Admitting: Neurology

## 2024-01-07 DIAGNOSIS — R569 Unspecified convulsions: Secondary | ICD-10-CM | POA: Diagnosis not present

## 2024-01-07 MED ORDER — LEVETIRACETAM 500 MG PO TABS: 500.0000 mg | ORAL_TABLET | Freq: Two times a day (BID) | ORAL | 4 refills | Status: DC

## 2024-01-07 NOTE — Patient Instructions (Signed)
 Great to meet you today.  Continue Keppra  for seizure prevention.  Call for any issues.  Follow-up in 1 year.  Thanks!!

## 2024-01-07 NOTE — Progress Notes (Signed)
 [facility]  PATIENT: Janet Dennis DOB: 1980/10/10  REFERRING CLINICIAN: Jolee Madelin Patch, MD HISTORY FROM: patient  REASON FOR VISIT: follow up  Virtual Visit via Video Note  I connected with Nat Sebastian Fischer on 01/07/24 at 11:15 AM EDT by a video enabled telemedicine application and verified that I am speaking with the correct person using two identifiers.  Location: Patient: at her home Provider: in the office    I discussed the limitations of evaluation and management by telemedicine and the availability of in person appointments. The patient expressed understanding and agreed to proceed.  HISTORICAL  CHIEF COMPLAINT:  No chief complaint on file.   HISTORY OF PRESENT ILLNESS:  Update 01/07/24 SS: Lives with her parents. Remains on Keppra  500 mg BID. No side effect. Last seizure was in 2022, single event reported. No health issues.   UPDATE (07/10/22, VRP): Since last visit, doing well. No seizures. Tolerating LEV.    UPDATE (07/06/21, VRP): Since last visit, doing well. Symptoms are stable. No seizures. No alleviating or aggravating factors. Tolerating LEV 500mg  twice a day.    UPDATE (11/03/20, VRP): 43 year old female with history of premature birth and blindness, here for evaluation of seizure.  Patient was the hospital in March 2022 for new onset seizure.  Due to history of premature birth, intracerebral hemorrhage, abnormal MRI of the brain she was started on antiseizure medication after this new onset seizure.  Since that time patient doing well.  No side effects of medication.  PRIOR HPI (08/18/20, Dr. Vanessa): Janet Dennis is a 43 y.o. 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 Patient's mother witnessed much of the episode.  Patient reports that she just got out of the shower (was sitting in the bed that the last thing that she remembers.   Patient wants report that she heard the patient get on the phone and noted that she was sitting at the edge of the bed with her head down.  She called out her name with no response, asked if she was doing okay with no response.  She noted that patient's hands rose up and was very stiff and she made a weird noise with some foaming at her mouth.  She immediately got the patient and helped her down to the ground and lay her on her side and by that time the event was over and patient appeared to be in deep sleep.  She immediately called EMS who arrived with a couple minutes and noted the patient was somnolent and not following any commands.  She was brought into the ED and was waking up more in the ED.  The entirety of the event lasted about somewhere between 1 to 2 minutes.  Patient has no recollection of the event.   Patient denies any recent signs or symptoms of an upper respiratory infection or UTI or gastroenteritis.  Endorses unintentional weight loss with hot flashes over the last several weeks along with sweating.  Denies any prior history of CNS infection, no history of strokes, no significant head injury with loss of consciousness, denies any recent sleep deprivation, no significant alcohol intake, does not use any recreational drugs.   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  REVIEW OF SYSTEMS: Full 14 system review of systems performed and negative with exception of: as per HPI.   ALLERGIES: No Known Allergies  HOME MEDICATIONS: Outpatient Medications Prior to Visit  Medication Sig Dispense Refill   methimazole  (TAPAZOLE ) 10 MG tablet Take 1 tablet by mouth once daily 90 tablet 0   levETIRAcetam  (KEPPRA ) 500 MG tablet TAKE 1 TABLET  BY MOUTH TWICE DAILY . APPOINTMENT REQUIRED FOR FUTURE REFILLS 30 tablet 0   No facility-administered medications prior to visit.    PAST MEDICAL HISTORY: 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   Premature baby    Seizures ([facility]) 08/2020    PAST SURGICAL HISTORY: Past Surgical History:  Procedure Laterality Date   PATENT DUCTUS ARTERIOUS REPAIR     as infant   SCAR REVISION     WISDOM TOOTH EXTRACTION      FAMILY HISTORY: Family History  Problem Relation Age of Onset   Thyroid  disease Father     SOCIAL HISTORY: Social History   Socioeconomic History   Marital status: Single    Spouse name: Not on file   Number of children: 0   Years of education: Not on file   Highest education level: Associate degree: academic program  Occupational History   Not on file  Tobacco Use   Smoking status: Never   Smokeless tobacco: Never  Vaping Use   Vaping status: Never Used  Substance and Sexual Activity   Alcohol use: Yes    Comment: occ   Drug use: Never   Sexual activity: Never    Birth control/protection: None  Other Topics Concern   Not on file  Social History Narrative   Lives with family   Social Drivers of Health   Financial Resource Strain: Low Risk  (10/29/2023)   Received from [facility]   Overall Financial Resource Strain (CARDIA)    Difficulty of Paying Living Expenses: Not hard at all  Food Insecurity: No Food Insecurity (10/29/2023)   [facility] from [facility]   Hunger Vital Sign    Within the past 12 months, you worried that your food would run out before you got the money to buy more.: Never true    Within the past 12 months, the food you bought just didn't last and you didn't have money to get more.: Never true  Transportation Needs: No Transportation Needs (10/29/2023)   Received from [facility] - Transportation    Lack of Transportation (Medical): No     Lack of Transportation (Non-Medical): No  Physical Activity: Unknown (10/29/2023)   Received from [facility]   Exercise Vital Sign    On average, how many days per week do you engage in moderate to strenuous exercise (like a brisk walk)?: Patient declined    Minutes of Exercise per Session: Not on file  Stress: No Stress Concern Present (10/29/2023)   Received from [facility] - Occupational Stress Questionnaire    Feeling of Stress : Not at all  Social Connections: Socially Integrated (10/29/2023)   Received from [facility]   Social Network    How would you rate your social network (family, work, friends)?: Good participation with social networks  Intimate Partner Violence: Not At Risk (10/29/2023)   Received from [facility]   HITS    Over the last 12 months how  often did your partner physically hurt you?: Never    Over the last 12 months how often did your partner insult you or talk down to you?: Never    Over the last 12 months how often did your partner threaten you with physical harm?: Never    Over the last 12 months how often did your partner scream or curse at you?: Never   PHYSICAL EXAM  Video visit, she is blind, her family assists with visit  DIAGNOSTIC DATA (LABS, IMAGING, TESTING) - I reviewed patient records, labs, notes, testing and imaging myself where available.  Lab Results  Component Value Date   WBC 6.4 08/18/2020   HGB 10.5 (L) 08/18/2020   HCT 32.1 (L) 08/18/2020   MCV 77.3 (L) 08/18/2020   PLT 286 08/18/2020      Component Value Date/Time   NA 142 08/18/2020 1258   K 3.8 08/18/2020 1258   CL 113 (H) 08/18/2020 1258   CO2 23 08/18/2020 1258   GLUCOSE 139 (H) 08/18/2020 1258   BUN 7 08/18/2020 1258   CREATININE 0.53 08/18/2020 1258   CALCIUM 9.1 08/18/2020 1258   PROT 5.9 (L) 08/18/2020 0436   ALBUMIN 2.8 (L) 08/18/2020 0436   AST 26 08/18/2020 0436   ALT 29 08/18/2020 0436   ALKPHOS 76  08/18/2020 0436   BILITOT 1.5 (H) 08/18/2020 0436   GFRNONAA >60 08/18/2020 1258   No results found for: CHOL, HDL, LDLCALC, LDLDIRECT, TRIG, CHOLHDL No results found for: YHAJ8R No results found for: VITAMINB12 Lab Results  Component Value Date   TSH <0.01 Repeated and verified X2. (L) 07/04/2021   08/18/20 MRI brain 1. 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 A focal cortical dysplasia would be the primary differential consideration. Correlation with EEG recommended. 2. 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 3. No other acute intracranial abnormality.  ASSESSMENT AND PLAN  43 y.o. year old female here with:   PLAN:  1.  Seizure (2022 new onset seizure (h/o prematurity, intracerebral hemorrhage, mesial temporal sclerosis; also new diagnosis of hyperthyroidism)  - Continue Keppra  500 mg twice a day - Call for seizure activity - Continue routine follow-up with primary care - Advised likely continue Keppra  given mesial temporal sclerosis  - Follow-up in 1 year virtually or sooner if needed   Lauraine Born, SCHARLENE, DNP  [facility] [address], [address] [place], [place] [address] [phone]

## 2024-01-11 ENCOUNTER — Other Ambulatory Visit: Payer: Self-pay | Admitting: Neurology

## 2024-01-11 MED ORDER — LEVETIRACETAM 500 MG PO TABS: 500.0000 mg | ORAL_TABLET | Freq: Two times a day (BID) | ORAL | 4 refills | Status: AC

## 2025-01-14 ENCOUNTER — Telehealth: Admitting: Neurology
